# Patient Record
Sex: Female | Born: 1975 | Race: Black or African American | Hispanic: No | Marital: Single | Smoking: Never smoker
Health system: Southern US, Community
[De-identification: ages and names within clinical notes are randomized; demographics above are authoritative.]

## PROBLEM LIST (undated history)

## (undated) DIAGNOSIS — L509 Urticaria, unspecified: Secondary | ICD-10-CM

## (undated) DIAGNOSIS — Z8489 Family history of other specified conditions: Secondary | ICD-10-CM

## (undated) DIAGNOSIS — R519 Headache, unspecified: Secondary | ICD-10-CM

## (undated) DIAGNOSIS — I10 Essential (primary) hypertension: Secondary | ICD-10-CM

## (undated) DIAGNOSIS — T783XXA Angioneurotic edema, initial encounter: Secondary | ICD-10-CM

## (undated) HISTORY — DX: Urticaria, unspecified: L50.9

## (undated) HISTORY — DX: Essential (primary) hypertension: I10

## (undated) HISTORY — PX: NO PAST SURGERIES: SHX2092

## (undated) HISTORY — DX: Angioneurotic edema, initial encounter: T78.3XXA

---

## 1998-02-08 ENCOUNTER — Emergency Department (HOSPITAL_COMMUNITY): Admission: EM | Admit: 1998-02-08 | Discharge: 1998-02-08 | Payer: Self-pay | Admitting: Emergency Medicine

## 1999-05-04 ENCOUNTER — Ambulatory Visit (HOSPITAL_COMMUNITY): Admission: RE | Admit: 1999-05-04 | Discharge: 1999-05-04 | Payer: Self-pay | Admitting: *Deleted

## 1999-07-30 ENCOUNTER — Encounter: Admission: RE | Admit: 1999-07-30 | Discharge: 1999-10-28 | Payer: Self-pay | Admitting: Obstetrics

## 1999-09-19 ENCOUNTER — Inpatient Hospital Stay (HOSPITAL_COMMUNITY): Admission: AD | Admit: 1999-09-19 | Discharge: 1999-09-19 | Payer: Self-pay | Admitting: *Deleted

## 1999-09-23 ENCOUNTER — Inpatient Hospital Stay (HOSPITAL_COMMUNITY): Admission: AD | Admit: 1999-09-23 | Discharge: 1999-09-27 | Payer: Self-pay | Admitting: *Deleted

## 2000-08-14 ENCOUNTER — Emergency Department (HOSPITAL_COMMUNITY): Admission: EM | Admit: 2000-08-14 | Discharge: 2000-08-14 | Payer: Self-pay | Admitting: Emergency Medicine

## 2000-12-13 ENCOUNTER — Emergency Department (HOSPITAL_COMMUNITY): Admission: EM | Admit: 2000-12-13 | Discharge: 2000-12-13 | Payer: Self-pay | Admitting: Emergency Medicine

## 2001-12-17 ENCOUNTER — Encounter: Payer: Self-pay | Admitting: *Deleted

## 2001-12-17 ENCOUNTER — Emergency Department (HOSPITAL_COMMUNITY): Admission: EM | Admit: 2001-12-17 | Discharge: 2001-12-17 | Payer: Self-pay | Admitting: *Deleted

## 2002-07-27 ENCOUNTER — Emergency Department (HOSPITAL_COMMUNITY): Admission: EM | Admit: 2002-07-27 | Discharge: 2002-07-27 | Payer: Self-pay | Admitting: Emergency Medicine

## 2002-08-17 ENCOUNTER — Other Ambulatory Visit: Admission: RE | Admit: 2002-08-17 | Discharge: 2002-08-17 | Payer: Self-pay | Admitting: Obstetrics and Gynecology

## 2003-08-24 ENCOUNTER — Other Ambulatory Visit: Admission: RE | Admit: 2003-08-24 | Discharge: 2003-08-24 | Payer: Self-pay | Admitting: Obstetrics and Gynecology

## 2003-09-13 ENCOUNTER — Encounter: Admission: RE | Admit: 2003-09-13 | Discharge: 2003-09-13 | Payer: Self-pay | Admitting: Obstetrics and Gynecology

## 2004-02-25 ENCOUNTER — Emergency Department (HOSPITAL_COMMUNITY): Admission: EM | Admit: 2004-02-25 | Discharge: 2004-02-25 | Payer: Self-pay | Admitting: Emergency Medicine

## 2004-05-10 ENCOUNTER — Ambulatory Visit (HOSPITAL_COMMUNITY): Admission: RE | Admit: 2004-05-10 | Discharge: 2004-05-10 | Payer: Self-pay | Admitting: Obstetrics and Gynecology

## 2004-08-27 ENCOUNTER — Other Ambulatory Visit: Admission: RE | Admit: 2004-08-27 | Discharge: 2004-08-27 | Payer: Self-pay | Admitting: Obstetrics and Gynecology

## 2004-09-03 ENCOUNTER — Encounter: Admission: RE | Admit: 2004-09-03 | Discharge: 2004-12-02 | Payer: Self-pay | Admitting: Obstetrics and Gynecology

## 2005-08-26 ENCOUNTER — Other Ambulatory Visit: Admission: RE | Admit: 2005-08-26 | Discharge: 2005-08-26 | Payer: Self-pay | Admitting: Obstetrics and Gynecology

## 2007-10-04 ENCOUNTER — Emergency Department (HOSPITAL_COMMUNITY): Admission: EM | Admit: 2007-10-04 | Discharge: 2007-10-04 | Payer: Self-pay | Admitting: Family Medicine

## 2009-03-17 ENCOUNTER — Emergency Department (HOSPITAL_COMMUNITY): Admission: EM | Admit: 2009-03-17 | Discharge: 2009-03-17 | Payer: Self-pay | Admitting: Emergency Medicine

## 2010-08-08 LAB — POCT PREGNANCY, URINE: Preg Test, Ur: NEGATIVE

## 2014-09-16 ENCOUNTER — Other Ambulatory Visit (HOSPITAL_COMMUNITY)
Admission: RE | Admit: 2014-09-16 | Discharge: 2014-09-16 | Disposition: A | Discharge status: Home / Self Care | Payer: BLUE CROSS/BLUE SHIELD | Source: Ambulatory Visit | Attending: Family Medicine | Admitting: Family Medicine

## 2014-09-16 ENCOUNTER — Other Ambulatory Visit: Payer: Self-pay | Admitting: Family Medicine

## 2014-09-16 DIAGNOSIS — Z01419 Encounter for gynecological examination (general) (routine) without abnormal findings: Secondary | ICD-10-CM | POA: Insufficient documentation

## 2014-09-16 DIAGNOSIS — Z8742 Personal history of other diseases of the female genital tract: Secondary | ICD-10-CM | POA: Diagnosis present

## 2014-09-20 LAB — CYTOLOGY - PAP

## 2016-01-17 DIAGNOSIS — Z3009 Encounter for other general counseling and advice on contraception: Secondary | ICD-10-CM | POA: Diagnosis not present

## 2016-01-17 DIAGNOSIS — Z113 Encounter for screening for infections with a predominantly sexual mode of transmission: Secondary | ICD-10-CM | POA: Diagnosis not present

## 2016-01-17 DIAGNOSIS — Z01419 Encounter for gynecological examination (general) (routine) without abnormal findings: Secondary | ICD-10-CM | POA: Diagnosis not present

## 2016-01-17 DIAGNOSIS — Z1151 Encounter for screening for human papillomavirus (HPV): Secondary | ICD-10-CM | POA: Diagnosis not present

## 2016-01-17 DIAGNOSIS — Z1321 Encounter for screening for nutritional disorder: Secondary | ICD-10-CM | POA: Diagnosis not present

## 2016-02-19 DIAGNOSIS — Z0000 Encounter for general adult medical examination without abnormal findings: Secondary | ICD-10-CM | POA: Diagnosis not present

## 2016-02-19 DIAGNOSIS — Z113 Encounter for screening for infections with a predominantly sexual mode of transmission: Secondary | ICD-10-CM | POA: Diagnosis not present

## 2016-02-20 DIAGNOSIS — N951 Menopausal and female climacteric states: Secondary | ICD-10-CM | POA: Diagnosis not present

## 2016-02-20 DIAGNOSIS — R635 Abnormal weight gain: Secondary | ICD-10-CM | POA: Diagnosis not present

## 2016-02-27 DIAGNOSIS — E538 Deficiency of other specified B group vitamins: Secondary | ICD-10-CM | POA: Diagnosis not present

## 2016-02-27 DIAGNOSIS — E559 Vitamin D deficiency, unspecified: Secondary | ICD-10-CM | POA: Diagnosis not present

## 2016-02-27 DIAGNOSIS — E8881 Metabolic syndrome: Secondary | ICD-10-CM | POA: Diagnosis not present

## 2016-02-29 DIAGNOSIS — E8881 Metabolic syndrome: Secondary | ICD-10-CM | POA: Diagnosis not present

## 2016-02-29 DIAGNOSIS — E559 Vitamin D deficiency, unspecified: Secondary | ICD-10-CM | POA: Diagnosis not present

## 2016-03-07 DIAGNOSIS — E538 Deficiency of other specified B group vitamins: Secondary | ICD-10-CM | POA: Diagnosis not present

## 2016-03-07 DIAGNOSIS — E559 Vitamin D deficiency, unspecified: Secondary | ICD-10-CM | POA: Diagnosis not present

## 2016-03-07 DIAGNOSIS — E8881 Metabolic syndrome: Secondary | ICD-10-CM | POA: Diagnosis not present

## 2016-03-14 DIAGNOSIS — E538 Deficiency of other specified B group vitamins: Secondary | ICD-10-CM | POA: Diagnosis not present

## 2016-03-14 DIAGNOSIS — E8881 Metabolic syndrome: Secondary | ICD-10-CM | POA: Diagnosis not present

## 2016-03-14 DIAGNOSIS — E559 Vitamin D deficiency, unspecified: Secondary | ICD-10-CM | POA: Diagnosis not present

## 2016-03-22 DIAGNOSIS — Z6841 Body Mass Index (BMI) 40.0 and over, adult: Secondary | ICD-10-CM | POA: Diagnosis not present

## 2016-03-22 DIAGNOSIS — Z713 Dietary counseling and surveillance: Secondary | ICD-10-CM | POA: Diagnosis not present

## 2016-04-08 DIAGNOSIS — E538 Deficiency of other specified B group vitamins: Secondary | ICD-10-CM | POA: Diagnosis not present

## 2016-04-08 DIAGNOSIS — E559 Vitamin D deficiency, unspecified: Secondary | ICD-10-CM | POA: Diagnosis not present

## 2016-04-08 DIAGNOSIS — E8881 Metabolic syndrome: Secondary | ICD-10-CM | POA: Diagnosis not present

## 2016-04-23 DIAGNOSIS — E8881 Metabolic syndrome: Secondary | ICD-10-CM | POA: Diagnosis not present

## 2016-04-23 DIAGNOSIS — E538 Deficiency of other specified B group vitamins: Secondary | ICD-10-CM | POA: Diagnosis not present

## 2016-04-23 DIAGNOSIS — E559 Vitamin D deficiency, unspecified: Secondary | ICD-10-CM | POA: Diagnosis not present

## 2016-04-30 DIAGNOSIS — E8881 Metabolic syndrome: Secondary | ICD-10-CM | POA: Diagnosis not present

## 2016-04-30 DIAGNOSIS — E538 Deficiency of other specified B group vitamins: Secondary | ICD-10-CM | POA: Diagnosis not present

## 2016-04-30 DIAGNOSIS — E559 Vitamin D deficiency, unspecified: Secondary | ICD-10-CM | POA: Diagnosis not present

## 2016-05-07 DIAGNOSIS — E538 Deficiency of other specified B group vitamins: Secondary | ICD-10-CM | POA: Diagnosis not present

## 2016-05-07 DIAGNOSIS — E559 Vitamin D deficiency, unspecified: Secondary | ICD-10-CM | POA: Diagnosis not present

## 2016-05-07 DIAGNOSIS — E8881 Metabolic syndrome: Secondary | ICD-10-CM | POA: Diagnosis not present

## 2016-05-14 DIAGNOSIS — E559 Vitamin D deficiency, unspecified: Secondary | ICD-10-CM | POA: Diagnosis not present

## 2016-05-14 DIAGNOSIS — E8881 Metabolic syndrome: Secondary | ICD-10-CM | POA: Diagnosis not present

## 2016-05-14 DIAGNOSIS — E538 Deficiency of other specified B group vitamins: Secondary | ICD-10-CM | POA: Diagnosis not present

## 2016-05-21 DIAGNOSIS — E559 Vitamin D deficiency, unspecified: Secondary | ICD-10-CM | POA: Diagnosis not present

## 2016-05-21 DIAGNOSIS — E538 Deficiency of other specified B group vitamins: Secondary | ICD-10-CM | POA: Diagnosis not present

## 2016-05-21 DIAGNOSIS — E8881 Metabolic syndrome: Secondary | ICD-10-CM | POA: Diagnosis not present

## 2016-06-04 DIAGNOSIS — E559 Vitamin D deficiency, unspecified: Secondary | ICD-10-CM | POA: Diagnosis not present

## 2016-06-04 DIAGNOSIS — E8881 Metabolic syndrome: Secondary | ICD-10-CM | POA: Diagnosis not present

## 2016-06-04 DIAGNOSIS — E538 Deficiency of other specified B group vitamins: Secondary | ICD-10-CM | POA: Diagnosis not present

## 2016-06-27 DIAGNOSIS — E538 Deficiency of other specified B group vitamins: Secondary | ICD-10-CM | POA: Diagnosis not present

## 2016-06-27 DIAGNOSIS — E559 Vitamin D deficiency, unspecified: Secondary | ICD-10-CM | POA: Diagnosis not present

## 2016-06-27 DIAGNOSIS — E8881 Metabolic syndrome: Secondary | ICD-10-CM | POA: Diagnosis not present

## 2016-10-10 DIAGNOSIS — Z13220 Encounter for screening for lipoid disorders: Secondary | ICD-10-CM | POA: Diagnosis not present

## 2016-10-10 DIAGNOSIS — E669 Obesity, unspecified: Secondary | ICD-10-CM | POA: Diagnosis not present

## 2016-10-10 DIAGNOSIS — Z131 Encounter for screening for diabetes mellitus: Secondary | ICD-10-CM | POA: Diagnosis not present

## 2016-10-10 DIAGNOSIS — Z6841 Body Mass Index (BMI) 40.0 and over, adult: Secondary | ICD-10-CM | POA: Diagnosis not present

## 2016-10-10 DIAGNOSIS — I10 Essential (primary) hypertension: Secondary | ICD-10-CM | POA: Diagnosis not present

## 2016-10-15 DIAGNOSIS — Z113 Encounter for screening for infections with a predominantly sexual mode of transmission: Secondary | ICD-10-CM | POA: Diagnosis not present

## 2016-10-22 DIAGNOSIS — L438 Other lichen planus: Secondary | ICD-10-CM | POA: Diagnosis not present

## 2017-01-15 DIAGNOSIS — I10 Essential (primary) hypertension: Secondary | ICD-10-CM | POA: Diagnosis not present

## 2017-01-15 DIAGNOSIS — M545 Low back pain: Secondary | ICD-10-CM | POA: Diagnosis not present

## 2017-02-28 DIAGNOSIS — I10 Essential (primary) hypertension: Secondary | ICD-10-CM | POA: Diagnosis not present

## 2017-02-28 DIAGNOSIS — Z6841 Body Mass Index (BMI) 40.0 and over, adult: Secondary | ICD-10-CM | POA: Diagnosis not present

## 2017-04-11 DIAGNOSIS — I10 Essential (primary) hypertension: Secondary | ICD-10-CM | POA: Diagnosis not present

## 2017-04-11 DIAGNOSIS — Z6841 Body Mass Index (BMI) 40.0 and over, adult: Secondary | ICD-10-CM | POA: Diagnosis not present

## 2017-05-27 DIAGNOSIS — Z6841 Body Mass Index (BMI) 40.0 and over, adult: Secondary | ICD-10-CM | POA: Diagnosis not present

## 2017-05-27 DIAGNOSIS — I10 Essential (primary) hypertension: Secondary | ICD-10-CM | POA: Insufficient documentation

## 2017-06-27 DIAGNOSIS — I10 Essential (primary) hypertension: Secondary | ICD-10-CM | POA: Diagnosis not present

## 2017-09-11 DIAGNOSIS — Z01419 Encounter for gynecological examination (general) (routine) without abnormal findings: Secondary | ICD-10-CM | POA: Diagnosis not present

## 2017-09-17 DIAGNOSIS — Z113 Encounter for screening for infections with a predominantly sexual mode of transmission: Secondary | ICD-10-CM | POA: Diagnosis not present

## 2017-09-17 DIAGNOSIS — Z01419 Encounter for gynecological examination (general) (routine) without abnormal findings: Secondary | ICD-10-CM | POA: Diagnosis not present

## 2017-09-17 DIAGNOSIS — Z1151 Encounter for screening for human papillomavirus (HPV): Secondary | ICD-10-CM | POA: Diagnosis not present

## 2017-12-09 DIAGNOSIS — Z713 Dietary counseling and surveillance: Secondary | ICD-10-CM | POA: Diagnosis not present

## 2018-01-16 DIAGNOSIS — Z713 Dietary counseling and surveillance: Secondary | ICD-10-CM | POA: Diagnosis not present

## 2018-03-19 DIAGNOSIS — Z23 Encounter for immunization: Secondary | ICD-10-CM | POA: Diagnosis not present

## 2018-03-26 DIAGNOSIS — Z713 Dietary counseling and surveillance: Secondary | ICD-10-CM | POA: Diagnosis not present

## 2018-05-18 DIAGNOSIS — Z713 Dietary counseling and surveillance: Secondary | ICD-10-CM | POA: Diagnosis not present

## 2018-06-23 DIAGNOSIS — Z23 Encounter for immunization: Secondary | ICD-10-CM | POA: Diagnosis not present

## 2018-08-17 DIAGNOSIS — Z713 Dietary counseling and surveillance: Secondary | ICD-10-CM | POA: Diagnosis not present

## 2018-10-27 DIAGNOSIS — Z1159 Encounter for screening for other viral diseases: Secondary | ICD-10-CM | POA: Diagnosis not present

## 2018-12-30 DIAGNOSIS — Z3009 Encounter for other general counseling and advice on contraception: Secondary | ICD-10-CM | POA: Diagnosis not present

## 2018-12-30 DIAGNOSIS — I169 Hypertensive crisis, unspecified: Secondary | ICD-10-CM | POA: Diagnosis not present

## 2018-12-30 DIAGNOSIS — Z713 Dietary counseling and surveillance: Secondary | ICD-10-CM | POA: Diagnosis not present

## 2019-01-01 DIAGNOSIS — Z131 Encounter for screening for diabetes mellitus: Secondary | ICD-10-CM | POA: Diagnosis not present

## 2019-01-01 DIAGNOSIS — Z113 Encounter for screening for infections with a predominantly sexual mode of transmission: Secondary | ICD-10-CM | POA: Diagnosis not present

## 2019-01-01 DIAGNOSIS — Z13220 Encounter for screening for lipoid disorders: Secondary | ICD-10-CM | POA: Diagnosis not present

## 2019-01-01 DIAGNOSIS — Z1329 Encounter for screening for other suspected endocrine disorder: Secondary | ICD-10-CM | POA: Diagnosis not present

## 2019-05-14 DIAGNOSIS — R21 Rash and other nonspecific skin eruption: Secondary | ICD-10-CM | POA: Diagnosis not present

## 2019-05-14 DIAGNOSIS — L299 Pruritus, unspecified: Secondary | ICD-10-CM | POA: Diagnosis not present

## 2019-05-14 DIAGNOSIS — L710 Perioral dermatitis: Secondary | ICD-10-CM | POA: Diagnosis not present

## 2019-07-23 DIAGNOSIS — E559 Vitamin D deficiency, unspecified: Secondary | ICD-10-CM | POA: Diagnosis not present

## 2019-07-23 DIAGNOSIS — E7800 Pure hypercholesterolemia, unspecified: Secondary | ICD-10-CM | POA: Diagnosis not present

## 2019-07-23 DIAGNOSIS — Z03818 Encounter for observation for suspected exposure to other biological agents ruled out: Secondary | ICD-10-CM | POA: Diagnosis not present

## 2019-07-23 DIAGNOSIS — Z79899 Other long term (current) drug therapy: Secondary | ICD-10-CM | POA: Diagnosis not present

## 2019-07-23 DIAGNOSIS — Z131 Encounter for screening for diabetes mellitus: Secondary | ICD-10-CM | POA: Diagnosis not present

## 2019-07-23 DIAGNOSIS — R0602 Shortness of breath: Secondary | ICD-10-CM | POA: Diagnosis not present

## 2019-07-23 DIAGNOSIS — N183 Chronic kidney disease, stage 3 unspecified: Secondary | ICD-10-CM | POA: Diagnosis not present

## 2019-09-21 DIAGNOSIS — Z79899 Other long term (current) drug therapy: Secondary | ICD-10-CM | POA: Diagnosis not present

## 2019-09-21 DIAGNOSIS — E782 Mixed hyperlipidemia: Secondary | ICD-10-CM | POA: Diagnosis not present

## 2019-09-21 DIAGNOSIS — E538 Deficiency of other specified B group vitamins: Secondary | ICD-10-CM | POA: Diagnosis not present

## 2019-09-21 DIAGNOSIS — E559 Vitamin D deficiency, unspecified: Secondary | ICD-10-CM | POA: Diagnosis not present

## 2020-08-09 ENCOUNTER — Ambulatory Visit (INDEPENDENT_AMBULATORY_CARE_PROVIDER_SITE_OTHER): Payer: Managed Care, Other (non HMO) | Admitting: Nurse Practitioner

## 2020-08-09 ENCOUNTER — Other Ambulatory Visit: Payer: Self-pay

## 2020-08-09 VITALS — BP 109/72 | HR 99 | Temp 97.9°F | Ht 63.0 in | Wt 228.4 lb

## 2020-08-09 DIAGNOSIS — N289 Disorder of kidney and ureter, unspecified: Secondary | ICD-10-CM | POA: Diagnosis not present

## 2020-08-09 DIAGNOSIS — Z6841 Body Mass Index (BMI) 40.0 and over, adult: Secondary | ICD-10-CM | POA: Diagnosis not present

## 2020-08-09 DIAGNOSIS — I10 Essential (primary) hypertension: Secondary | ICD-10-CM | POA: Diagnosis not present

## 2020-08-09 DIAGNOSIS — Z7689 Persons encountering health services in other specified circumstances: Secondary | ICD-10-CM

## 2020-08-09 MED ORDER — HYDROCHLOROTHIAZIDE 25 MG PO TABS: 25.0000 mg | ORAL_TABLET | Freq: Every day | ORAL | 1 refills | Status: DC

## 2020-08-09 NOTE — Progress Notes (Signed)
New Patient Office Visit  Subjective:  Patient ID: Helen Clark, female    DOB: 1976-02-12  Age: 45 y.o. MRN: [id number]  CC:  Chief Complaint  Patient presents with  . New Patient (Initial Visit)    HPI Helen Clark presents to establish care with new primary care provider. She is a patient at [facility] for [employer]. She takes Saxenda injections daily. She takes 3mg  every day. She has lost more than 20 pounds since starting this program. In addition to medication, she has increased her physical activity. She eats a 1200 calorie diet. She does take Linzess on as needed basis for constipation, as this medication is known to cause constipation.  At her most recent visit, she did have blood work done. She was told that she was in "kidney failure." she will bring a copy of these lab results to her next visit. She was able to pull them up on her phone App. Renal functions were abnormal, but she would not be considered in renal failure. She does take triamterene/HCTZ 37.5/25mg  tablets every day. Since both medications are diuretic in nature, this may be causing a bit of kidney damage over time. Her blood pressure is very well managed, even on low side of normal.  Her lipid panel on this set of labs was very good. Total cholesterol 123, HDL 51, triglycerides 47, and LDL 63.  The patient denies chest pain, chest pressure, shortness of breath, or headaches. She denies dizziness, or vertigo. She denies nausea, vomiting, or changes in bowel habits.  She is due to have annual wellness visit and pap smear.   Past Medical History:  Diagnosis Date  . Hypertension    Phreesia 08/09/2020    History reviewed. No pertinent surgical history.  Family History  Problem Relation Age of Onset  . High blood pressure Mother   . Stroke Mother   . High Cholesterol Father     Social History   Socioeconomic History  . Marital status: Single    Spouse name: Not on file  . Number of children:  Not on file  . Years of education: Not on file  . Highest education level: Not on file  Occupational History  . 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 Smoking status: Never Smoker  . Smokeless tobacco: Never Used  Substance and Sexual Activity  . Alcohol use: Never  . Drug use: Never  . Sexual activity: Not Currently  Other Topics Concern  . Not on file  Social History Narrative  . Not on file   Social Determinants of Health   Financial Resource Strain: Not on file  Food Insecurity: Not on file  Transportation Needs: Not on file  Physical Activity: Not on file  Stress: Not on file  Social Connections: Not on file  Intimate Partner Violence: Not on file    ROS Review of Systems  Constitutional: Negative for chills and fever.       Has lost more than 20 pounds since starting on saxenda for weight management.   HENT: Negative for congestion, postnasal drip, rhinorrhea, sinus pressure and sinus pain.   Eyes: Negative.   Respiratory: Negative for cough, chest tightness, shortness of breath and wheezing.   Cardiovascular: Negative for chest pain and palpitations.  Gastrointestinal: Positive for constipation. Negative for diarrhea, nausea and vomiting.       Intermittent constipation.   Endocrine: Negative.   Musculoskeletal: Negative for arthralgias, back pain and myalgias.  Intermittent groin pain since increased physical activity.   Skin: Negative for rash.  Allergic/Immunologic: Negative for environmental allergies.  Neurological: Negative for dizziness, weakness and headaches.  Hematological: Negative for adenopathy.  Psychiatric/Behavioral: Negative for dysphoric mood and sleep disturbance. The patient is not nervous/anxious.   All other systems reviewed and are negative.   Objective:   Today's Vitals   08/09/20 1501  BP: 109/72  Pulse: 99  Temp: 97.9 F (36.6 C)  SpO2: 97%  Weight: 228 lb 6.4 oz (103.6 kg)  Height: 5\' 3"  (1.6 m)   Body mass index is 40.46  kg/m.   Physical Exam Vitals and nursing note reviewed.  Constitutional:      Appearance: Normal appearance. She is well-developed. She is obese.  HENT:     Head: Normocephalic.     Nose: Nose normal.  Eyes:     Extraocular Movements: Extraocular movements intact.     Conjunctiva/sclera: Conjunctivae normal.     Pupils: Pupils are equal, round, and reactive to light.  Cardiovascular:     Rate and Rhythm: Normal rate and regular rhythm.     Pulses: Normal pulses.     Heart sounds: Normal heart sounds.  Pulmonary:     Effort: Pulmonary effort is normal.     Breath sounds: Normal breath sounds.  Abdominal:     General: Bowel sounds are normal.     Palpations: Abdomen is soft.     Tenderness: There is no abdominal tenderness.  Musculoskeletal:        General: Normal range of motion.     Cervical back: Normal range of motion and neck supple.  Skin:    General: Skin is warm and dry.     Capillary Refill: Capillary refill takes less than 2 seconds.  Neurological:     General: No focal deficit present.     Mental Status: She is alert and oriented to person, place, and time.  Psychiatric:        Mood and Affect: Mood normal.        Behavior: Behavior normal.        Thought Content: Thought content normal.        Judgment: Judgment normal.     Assessment & Plan:  1. Encounter to establish care Appointment today to establish primary care provdier.   2. Abnormal renal function Able to view patient's lab results showing elevated renal functions and calculated GFR. Will d/c triamterene/HCTZ. Start HCTZ only. Will recheck BMP prior to next visit.   3. Essential hypertension D/c triamterene/HCTZ and start HCTZ only. Advised her to monitor her blood pressure closely at home. Notify the office if it starts to consistently run higher than 140s/80s.  - hydrochlorothiazide (HYDRODIURIL) 25 MG tablet; Take 1 tablet (25 mg total) by mouth daily.  Dispense: 90 tablet; Refill: 1  4. BMI  40.0-44.9, adult (HCC) Continue saxenda as prescribed.   Problem List Items Addressed This Visit      Cardiovascular and Mediastinum   Essential hypertension   Relevant Medications   hydrochlorothiazide (HYDRODIURIL) 25 MG tablet     Other   Encounter to establish care - Primary   Abnormal renal function   BMI 40.0-44.9, adult (HCC)   Relevant Medications   SAXENDA 18 MG/3ML SOPN      Outpatient Encounter Medications as of 08/09/2020  Medication Sig  . hydrochlorothiazide (HYDRODIURIL) 25 MG tablet Take 1 tablet (25 mg total) by mouth daily.  . medroxyPROGESTERone (DEPO-PROVERA) 150 MG/ML injection Inject 150  mg into the muscle every 3 (three) months.  . [DISCONTINUED] triamterene-hydrochlorothiazide (DYAZIDE) 37.5-25 MG capsule Take 1 capsule by mouth daily.  Marland Kitchen SAXENDA 18 MG/3ML SOPN Inject 3 mg into the skin daily.   No facility-administered encounter medications on file as of 08/09/2020.   Time spent with the patient was approximately 45 minutes. This time included reviewing progress notes, labs, imaging studies, and discussing plan for follow up.   Follow-up: Return in about 2 weeks (around 08/23/2020) for 2 week lab draw - BMP only - 6 week physical with pap .   Carlean Jews, NP

## 2020-08-09 NOTE — Patient Instructions (Signed)

## 2020-08-11 ENCOUNTER — Ambulatory Visit: Payer: BLUE CROSS/BLUE SHIELD | Admitting: Nurse Practitioner

## 2020-08-14 ENCOUNTER — Other Ambulatory Visit: Payer: Self-pay | Admitting: Nurse Practitioner

## 2020-08-14 DIAGNOSIS — Z0000 Encounter for general adult medical examination without abnormal findings: Secondary | ICD-10-CM

## 2020-08-14 DIAGNOSIS — I10 Essential (primary) hypertension: Secondary | ICD-10-CM

## 2020-08-15 DIAGNOSIS — I10 Essential (primary) hypertension: Secondary | ICD-10-CM | POA: Insufficient documentation

## 2020-08-15 DIAGNOSIS — Z7689 Persons encountering health services in other specified circumstances: Secondary | ICD-10-CM | POA: Insufficient documentation

## 2020-08-15 DIAGNOSIS — Z6841 Body Mass Index (BMI) 40.0 and over, adult: Secondary | ICD-10-CM | POA: Insufficient documentation

## 2020-08-15 DIAGNOSIS — N289 Disorder of kidney and ureter, unspecified: Secondary | ICD-10-CM | POA: Insufficient documentation

## 2020-08-23 ENCOUNTER — Other Ambulatory Visit: Payer: Self-pay

## 2020-08-23 ENCOUNTER — Other Ambulatory Visit: Payer: Managed Care, Other (non HMO)

## 2020-08-23 DIAGNOSIS — Z0000 Encounter for general adult medical examination without abnormal findings: Secondary | ICD-10-CM

## 2020-08-23 DIAGNOSIS — I10 Essential (primary) hypertension: Secondary | ICD-10-CM

## 2020-08-24 LAB — BASIC METABOLIC PANEL
BUN/Creatinine Ratio: 11 (ref 9–23)
BUN: 14 mg/dL (ref 6–24)
CO2: 21 mmol/L (ref 20–29)
Calcium: 9.6 mg/dL (ref 8.7–10.2)
Chloride: 104 mmol/L (ref 96–106)
Creatinine, Ser: 1.22 mg/dL — ABNORMAL HIGH (ref 0.57–1.00)
Glucose: 85 mg/dL (ref 65–99)
Potassium: 3.6 mmol/L (ref 3.5–5.2)
Sodium: 144 mmol/L (ref 134–144)
eGFR: 57 mL/min/{1.73_m2} — ABNORMAL LOW (ref >59)

## 2020-08-24 NOTE — Progress Notes (Signed)
Hi. Can you let the patient know that I believe her renal functions are improved. She should schedule appointment for her physical and pap and see how blood pressure is doing. Thanks.

## 2020-09-11 ENCOUNTER — Encounter: Payer: Self-pay | Admitting: Nurse Practitioner

## 2020-09-11 ENCOUNTER — Other Ambulatory Visit: Payer: Self-pay

## 2020-09-11 ENCOUNTER — Ambulatory Visit (INDEPENDENT_AMBULATORY_CARE_PROVIDER_SITE_OTHER): Payer: Managed Care, Other (non HMO) | Admitting: Nurse Practitioner

## 2020-09-11 VITALS — BP 136/91 | HR 86 | Temp 98.5°F | Ht 63.0 in | Wt 226.7 lb

## 2020-09-11 DIAGNOSIS — F0781 Postconcussional syndrome: Secondary | ICD-10-CM | POA: Diagnosis not present

## 2020-09-11 DIAGNOSIS — I10 Essential (primary) hypertension: Secondary | ICD-10-CM

## 2020-09-11 DIAGNOSIS — F4310 Post-traumatic stress disorder, unspecified: Secondary | ICD-10-CM

## 2020-09-11 DIAGNOSIS — S0990XD Unspecified injury of head, subsequent encounter: Secondary | ICD-10-CM
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
## 2020-09-11 NOTE — Patient Instructions (Signed)
Post-Concussion Syndrome  A concussion is a brain injury. Post-concussion syndrome is when symptoms last longer than normal after a head injury. What are the causes? The cause of this condition is not known. It can happen if your head injury was mild or very bad. What increases the risk?  Being female.  Being young.  Having had a head injury before.  Being sad (depressed) or feeling worried or nervous (having anxiety).  Fainting when you got your concussion or not being able to remember it.  Having many symptoms or very bad symptoms at the time of your injury. What are the signs or symptoms? After a head injury, you may have physical symptoms, such as:  Having headaches.  Feeling tired.  Feeling dizzy.  Feeling weak.  Having trouble seeing.  Having trouble in bright lights.  Having trouble hearing.  Having problems balancing. You may also have mental and emotional symptoms, such as:  Not being able to remember things.  Not being able to focus.  Having trouble sleeping.  Feeling grouchy (irritable).  Feeling worried.  Feeling sad.  Having trouble learning new things. These can last from weeks to months. How is this treated? Treatment for this condition may depend on your symptoms. Symptoms usually go away on their own over time. If you need treatment, it may include:  Taking medicines.  Resting your brain and body for a few days.  Doing therapy to help you recover (rehabilitation therapy). This may include: ? Physical or occupational therapy. This may include exercises. ? Talking to a counselor. ? Speech therapy. ? Therapy to help your eyes. Follow these instructions at home: Medicines  Take all medicines only as told by your doctor.  Avoid pain medicines that have opioids in them. Ask your doctor which pain medicine to take. Activity  Limit activities as told by your doctor. This may include not doing the following: ? Homework. ? Job-related  work. ? Hard thinking. ? Watching TV. ? Using a computer or phone. ? Puzzles. ? Exercise. ? Sports.  Slowly return to your normal activity as told by your doctor.  Stop an activity if you have symptoms.  Rest. Try to: ? Sleep 7-9 hours each night. ? Take naps or breaks when you feel tired during the day.  Do not do anything that may cause you to get hurt again right away. General instructions  Do not drink alcohol until your doctor says that you can.  Keep track of your symptoms.  Keep all follow-up visits as told by your doctor. This is important.   Contact a doctor if:  You do not improve.  You get worse.  You have another injury. Get help right away if:  You have a very bad headache or a headache that gets worse.  You feel confused.  You feel very sleepy.  You faint.  You vomit.  You feel weak in any part of your body.  You feel numb in any part of your body.  You start shaking (have a seizure).  You have trouble talking. Summary  This condition is when symptoms last longer than normal after a head injury.  Limit your activities after your injury. Slowly return to normal activities as told by your doctor.  Rest.  Do not drink alcohol and avoid pain medicines that have opioids in them.  Call your doctor if your symptoms get worse. This information is not intended to replace advice given to you by your health care provider. Make sure you  discuss any questions you have with your health care provider. Document Revised: 04/14/2019 Document Reviewed: 04/14/2019 Elsevier Patient Education  2021 [employer].

## 2020-09-25 DIAGNOSIS — F0781 Postconcussional syndrome: Secondary | ICD-10-CM | POA: Insufficient documentation

## 2020-09-25 DIAGNOSIS — F4310 Post-traumatic stress disorder, unspecified: Secondary | ICD-10-CM | POA: Insufficient documentation

## 2020-09-25 DIAGNOSIS — S0990XA Unspecified injury of head, initial encounter: Secondary | ICD-10-CM | POA: Insufficient documentation

## 2020-10-03 ENCOUNTER — Ambulatory Visit (INDEPENDENT_AMBULATORY_CARE_PROVIDER_SITE_OTHER): Payer: Managed Care, Other (non HMO) | Admitting: Nurse Practitioner

## 2020-10-03 ENCOUNTER — Other Ambulatory Visit: Payer: Self-pay

## 2020-10-03 ENCOUNTER — Other Ambulatory Visit (HOSPITAL_COMMUNITY)
Admission: RE | Admit: 2020-10-03 | Discharge: 2020-10-03 | Disposition: A | Discharge status: Home / Self Care | Payer: Managed Care, Other (non HMO) | Source: Ambulatory Visit | Attending: Nurse Practitioner | Admitting: Nurse Practitioner

## 2020-10-03 ENCOUNTER — Encounter: Payer: Self-pay | Admitting: Nurse Practitioner

## 2020-10-03 VITALS — BP 131/84 | HR 80 | Temp 98.9°F | Ht 63.0 in | Wt 227.4 lb

## 2020-10-03 DIAGNOSIS — F0781 Postconcussional syndrome: Secondary | ICD-10-CM

## 2020-10-03 DIAGNOSIS — Z6841 Body Mass Index (BMI) 40.0 and over, adult: Secondary | ICD-10-CM | POA: Diagnosis not present

## 2020-10-03 DIAGNOSIS — I10 Essential (primary) hypertension: Secondary | ICD-10-CM | POA: Diagnosis not present

## 2020-10-03 DIAGNOSIS — Z0000 Encounter for general adult medical examination without abnormal findings: Secondary | ICD-10-CM | POA: Insufficient documentation

## 2020-10-03 DIAGNOSIS — N289 Disorder of kidney and ureter, unspecified: Secondary | ICD-10-CM | POA: Diagnosis not present

## 2020-10-03 DIAGNOSIS — Z3042 Encounter for surveillance of injectable contraceptive: Secondary | ICD-10-CM

## 2020-10-03 DIAGNOSIS — Z0001 Encounter for general adult medical examination with abnormal findings: Secondary | ICD-10-CM

## 2020-10-03 DIAGNOSIS — B9689 Other specified bacterial agents as the cause of diseases classified elsewhere: Secondary | ICD-10-CM

## 2020-10-03 DIAGNOSIS — F4310 Post-traumatic stress disorder, unspecified: Secondary | ICD-10-CM

## 2020-10-03 DIAGNOSIS — Z01419 Encounter for gynecological examination (general) (routine) without abnormal findings: Secondary | ICD-10-CM | POA: Insufficient documentation

## 2020-10-03 DIAGNOSIS — N760 Acute vaginitis: Secondary | ICD-10-CM | POA: Insufficient documentation

## 2020-10-03 MED ORDER — SAXENDA 18 MG/3ML ~~LOC~~ SOPN: 3.0000 mg | PEN_INJECTOR | Freq: Every day | SUBCUTANEOUS | 2 refills | Status: DC

## 2020-10-03 MED ORDER — MEDROXYPROGESTERONE ACETATE 150 MG/ML IM SUSP: 150.0000 mg | INTRAMUSCULAR | 3 refills | Status: DC

## 2020-10-03 NOTE — Patient Instructions (Signed)
Calorie Counting for Weight Loss Calories are units of energy. Your body needs a certain number of calories from food to keep going throughout the day. When you eat or drink more calories than your body needs, your body stores the extra calories mostly as fat. When you eat or drink fewer calories than your body needs, your body burns fat to get the energy it needs. Calorie counting means keeping track of how many calories you eat and drink each day. Calorie counting can be helpful if you need to lose weight. If you eat fewer calories than your body needs, you should lose weight. Ask your health care provider what a healthy weight is for you. For calorie counting to work, you will need to eat the right number of calories each day to lose a healthy amount of weight per week. A dietitian can help you figure out how many calories you need in a day and will suggest ways to reach your calorie goal.  A healthy amount of weight to lose each week is usually 1-2 lb (0.5-0.9 kg). This usually means that your daily calorie intake should be reduced by 500-750 calories.  Eating 1,200-1,500 calories a day can help most women lose weight.  Eating 1,500-1,800 calories a day can help most men lose weight. What do I need to know about calorie counting? Work with your health care provider or dietitian to determine how many calories you should get each day. To meet your daily calorie goal, you will need to:  Find out how many calories are in each food that you would like to eat. Try to do this before you eat.  Decide how much of the food you plan to eat.  Keep a food log. Do this by writing down what you ate and how many calories it had. To successfully lose weight, it is important to balance calorie counting with a healthy lifestyle that includes regular activity. Where do I find calorie information? The number of calories in a food can be found on a Nutrition Facts label. If a food does not have a Nutrition Facts  label, try to look up the calories online or ask your dietitian for help. Remember that calories are listed per serving. If you choose to have more than one serving of a food, you will have to multiply the calories per serving by the number of servings you plan to eat. For example, the label on a package of bread might say that a serving size is 1 slice and that there are 90 calories in a serving. If you eat 1 slice, you will have eaten 90 calories. If you eat 2 slices, you will have eaten 180 calories.   How do I keep a food log? After each time that you eat, record the following in your food log as soon as possible:  What you ate. Be sure to include toppings, sauces, and other extras on the food.  How much you ate. This can be measured in cups, ounces, or number of items.  How many calories were in each food and drink.  The total number of calories in the food you ate. Keep your food log near you, such as in a pocket-sized notebook or on an app or website on your mobile phone. Some programs will calculate calories for you and show you how many calories you have left to meet your daily goal. What are some portion-control tips?  Know how many calories are in a serving. This will   help you know how many servings you can have of a certain food.  Use a measuring cup to measure serving sizes. You could also try weighing out portions on a kitchen scale. With time, you will be able to estimate serving sizes for some foods.  Take time to put servings of different foods on your favorite plates or in your favorite bowls and cups so you know what a serving looks like.  Try not to eat straight from a food's packaging, such as from a bag or box. Eating straight from the package makes it hard to see how much you are eating and can lead to overeating. Put the amount you would like to eat in a cup or on a plate to make sure you are eating the right portion.  Use smaller plates, glasses, and bowls for smaller  portions and to prevent overeating.  Try not to multitask. For example, avoid watching TV or using your computer while eating. If it is time to eat, sit down at a table and enjoy your food. This will help you recognize when you are full. It will also help you be more mindful of what and how much you are eating. What are tips for following this plan? Reading food labels  Check the calorie count compared with the serving size. The serving size may be smaller than what you are used to eating.  Check the source of the calories. Try to choose foods that are high in protein, fiber, and vitamins, and low in saturated fat, trans fat, and sodium. Shopping  Read nutrition labels while you shop. This will help you make healthy decisions about which foods to buy.  Pay attention to nutrition labels for low-fat or fat-free foods. These foods sometimes have the same number of calories or more calories than the full-fat versions. They also often have added sugar, starch, or salt to make up for flavor that was removed with the fat.  Make a grocery list of lower-calorie foods and stick to it. Cooking  Try to cook your favorite foods in a healthier way. For example, try baking instead of frying.  Use low-fat dairy products. Meal planning  Use more fruits and vegetables. One-half of your plate should be fruits and vegetables.  Include lean proteins, such as chicken, turkey, and fish. Lifestyle Each week, aim to do one of the following:  150 minutes of moderate exercise, such as walking.  75 minutes of vigorous exercise, such as running. General information  Know how many calories are in the foods you eat most often. This will help you calculate calorie counts faster.  Find a way of tracking calories that works for you. Get creative. Try different apps or programs if writing down calories does not work for you. What foods should I eat?  Eat nutritious foods. It is better to have a nutritious,  high-calorie food, such as an avocado, than a food with few nutrients, such as a bag of potato chips.  Use your calories on foods and drinks that will fill you up and will not leave you hungry soon after eating. ? Examples of foods that fill you up are nuts and nut butters, vegetables, lean proteins, and high-fiber foods such as whole grains. High-fiber foods are foods with more than 5 g of fiber per serving.  Pay attention to calories in drinks. Low-calorie drinks include water and unsweetened drinks. The items listed above may not be a complete list of foods and beverages you can eat.   Contact a dietitian for more information.   What foods should I limit? Limit foods or drinks that are not good sources of vitamins, minerals, or protein or that are high in unhealthy fats. These include:  Candy.  Other sweets.  Sodas, specialty coffee drinks, alcohol, and juice. The items listed above may not be a complete list of foods and beverages you should avoid. Contact a dietitian for more information. How do I count calories when eating out?  Pay attention to portions. Often, portions are much larger when eating out. Try these tips to keep portions smaller: ? Consider sharing a meal instead of getting your own. ? If you get your own meal, eat only half of it. Before you start eating, ask for a container and put half of your meal into it. ? When available, consider ordering smaller portions from the menu instead of full portions.  Pay attention to your food and drink choices. Knowing the way food is cooked and what is included with the meal can help you eat fewer calories. ? If calories are listed on the menu, choose the lower-calorie options. ? Choose dishes that include vegetables, fruits, whole grains, low-fat dairy products, and lean proteins. ? Choose items that are boiled, broiled, grilled, or steamed. Avoid items that are buttered, battered, fried, or served with cream sauce. Items labeled as  crispy are usually fried, unless stated otherwise. ? Choose water, low-fat milk, unsweetened iced tea, or other drinks without added sugar. If you want an alcoholic beverage, choose a lower-calorie option, such as a glass of wine or light beer. ? Ask for dressings, sauces, and syrups on the side. These are usually high in calories, so you should limit the amount you eat. ? If you want a salad, choose a garden salad and ask for grilled meats. Avoid extra toppings such as bacon, cheese, or fried items. Ask for the dressing on the side, or ask for olive oil and vinegar or lemon to use as dressing.  Estimate how many servings of a food you are given. Knowing serving sizes will help you be aware of how much food you are eating at restaurants. Where to find more information  Centers for Disease Control and Prevention: www.cdc.gov  [place] Department of Agriculture: myplate.gov Summary  Calorie counting means keeping track of how many calories you eat and drink each day. If you eat fewer calories than your body needs, you should lose weight.  A healthy amount of weight to lose per week is usually 1-2 lb (0.5-0.9 kg). This usually means reducing your daily calorie intake by 500-750 calories.  The number of calories in a food can be found on a Nutrition Facts label. If a food does not have a Nutrition Facts label, try to look up the calories online or ask your dietitian for help.  Use smaller plates, glasses, and bowls for smaller portions and to prevent overeating.  Use your calories on foods and drinks that will fill you up and not leave you hungry shortly after a meal. This information is not intended to replace advice given to you by your health care provider. Make sure you discuss any questions you have with your health care provider. Document Revised: 06/03/2019 Document Reviewed: 06/03/2019 Elsevier Patient Education  2021 Elsevier Inc.  
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
## 2020-10-05 LAB — CYTOLOGY - PAP
Adequacy: ABSENT
Comment: NEGATIVE
Diagnosis: NEGATIVE
High risk HPV: NEGATIVE

## 2020-10-05 NOTE — Progress Notes (Signed)
Waiting on all results for BV. Pap normal.

## 2020-10-07 LAB — CT, NG, M GENITALIUM NAA, SWAB
Chlamydia trachomatis, NAA: NEGATIVE
Mycoplasma genitalium NAA: NEGATIVE
Neisseria gonorrhoeae, NAA: NEGATIVE

## 2020-10-09 ENCOUNTER — Encounter: Payer: Self-pay | Admitting: Nurse Practitioner

## 2020-10-09 NOTE — Progress Notes (Signed)
Pap normal. Negative for BV or other sexually transmitted disease. Message sent to patient via MyChart.

## 2020-11-21 ENCOUNTER — Encounter: Payer: Self-pay | Admitting: Nurse Practitioner

## 2020-11-21 ENCOUNTER — Other Ambulatory Visit: Payer: Self-pay

## 2020-11-21 ENCOUNTER — Ambulatory Visit (INDEPENDENT_AMBULATORY_CARE_PROVIDER_SITE_OTHER): Payer: Managed Care, Other (non HMO) | Admitting: Nurse Practitioner

## 2020-11-21 VITALS — BP 136/89 | HR 84 | Temp 97.4°F | Ht 63.0 in | Wt 213.7 lb

## 2020-11-21 DIAGNOSIS — F4310 Post-traumatic stress disorder, unspecified: Secondary | ICD-10-CM

## 2020-11-21 DIAGNOSIS — I10 Essential (primary) hypertension: Secondary | ICD-10-CM | POA: Diagnosis not present

## 2020-11-21 DIAGNOSIS — F0781 Postconcussional syndrome: Secondary | ICD-10-CM
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
## 2020-12-11 ENCOUNTER — Other Ambulatory Visit: Payer: Self-pay

## 2020-12-11 ENCOUNTER — Encounter: Payer: Self-pay | Admitting: Nurse Practitioner

## 2020-12-11 ENCOUNTER — Ambulatory Visit (INDEPENDENT_AMBULATORY_CARE_PROVIDER_SITE_OTHER): Payer: Managed Care, Other (non HMO) | Admitting: Nurse Practitioner

## 2020-12-11 VITALS — BP 138/97 | HR 83 | Temp 98.0°F | Ht 63.0 in | Wt 215.4 lb

## 2020-12-11 DIAGNOSIS — R413 Other amnesia: Secondary | ICD-10-CM | POA: Diagnosis not present

## 2020-12-11 DIAGNOSIS — K581 Irritable bowel syndrome with constipation: Secondary | ICD-10-CM

## 2020-12-11 DIAGNOSIS — F0781 Postconcussional syndrome: Secondary | ICD-10-CM

## 2020-12-11 DIAGNOSIS — F4310 Post-traumatic stress disorder, unspecified: Secondary | ICD-10-CM | POA: Diagnosis not present

## 2020-12-11 DIAGNOSIS — I10 Essential (primary) hypertension: Secondary | ICD-10-CM

## 2020-12-11 DIAGNOSIS — Z6838 Body mass index (BMI) 38.0-38.9, adult: Secondary | ICD-10-CM

## 2020-12-11 DIAGNOSIS — N289 Disorder of kidney and ureter, unspecified: Secondary | ICD-10-CM

## 2020-12-11 DIAGNOSIS — S0990XD Unspecified injury of head, subsequent encounter: Secondary | ICD-10-CM

## 2020-12-11 MED ORDER — LINACLOTIDE 290 MCG PO CAPS: 290.0000 ug | ORAL_CAPSULE | Freq: Every day | ORAL | 3 refills | Status: DC
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
## 2020-12-12 ENCOUNTER — Encounter: Payer: Self-pay | Admitting: Psychology

## 2020-12-12 LAB — BASIC METABOLIC PANEL
BUN/Creatinine Ratio: 11 (ref 9–23)
BUN: 11 mg/dL (ref 6–24)
CO2: 20 mmol/L (ref 20–29)
Calcium: 9.1 mg/dL (ref 8.7–10.2)
Chloride: 109 mmol/L — ABNORMAL HIGH (ref 96–106)
Creatinine, Ser: 1.03 mg/dL — ABNORMAL HIGH (ref 0.57–1.00)
Glucose: 95 mg/dL (ref 65–99)
Potassium: 3.9 mmol/L (ref 3.5–5.2)
Sodium: 146 mmol/L — ABNORMAL HIGH (ref 134–144)
eGFR: 70 mL/min/{1.73_m2} (ref >59)

## 2020-12-12 NOTE — Progress Notes (Signed)
Please let the patient know that her kidney functions are much better. Not quite normal, but nearly back to normal. We will recheck again with her next visit. Thanks.

## 2020-12-18 ENCOUNTER — Telehealth: Payer: Self-pay | Admitting: Nurse Practitioner

## 2020-12-18 NOTE — Telephone Encounter (Signed)
Contacted patient insurance and was advised no PA needed for CPT code 32549. Calling [facility] to notify and have them contact patient to schedule. Spoke with Danisha at GI and she is reaching out to patient. AS, CMA

## 2020-12-19 ENCOUNTER — Encounter: Payer: Self-pay | Admitting: Nurse Practitioner

## 2020-12-26 ENCOUNTER — Encounter: Payer: Self-pay | Admitting: Nurse Practitioner

## 2020-12-28 ENCOUNTER — Encounter: Payer: Self-pay | Admitting: Nurse Practitioner

## 2020-12-29 ENCOUNTER — Other Ambulatory Visit: Payer: Self-pay

## 2020-12-29 ENCOUNTER — Encounter (HOSPITAL_COMMUNITY): Payer: Self-pay | Admitting: *Deleted

## 2020-12-29 NOTE — Progress Notes (Signed)
Spoke with pt for pre-op call. Pt denies cardiac history or Diabetes. Pt is treated for HTN.   Pt's surgery is scheduled as ambulatory so no Covid test is required prior to surgery.  

## 2021-01-01 NOTE — Anesthesia Preprocedure Evaluation (Addendum)
Anesthesia Evaluation  Patient identified by MRN, date of birth, ID band Patient awake    Reviewed: Allergy & Precautions, NPO status , Patient's Chart, lab work & pertinent test results  Airway Mallampati: II  TM Distance: >3 FB Neck ROM: Full    Dental no notable dental hx. (+) Teeth Intact, Dental Advisory Given   Pulmonary    Pulmonary exam normal breath sounds clear to auscultation       Cardiovascular hypertension, Pt. on medications Normal cardiovascular exam Rhythm:Regular Rate:Normal     Neuro/Psych  Headaches, Anxiety    GI/Hepatic Neg liver ROS,   Endo/Other  negative endocrine ROS  Renal/GU negative Renal ROS     Musculoskeletal negative musculoskeletal ROS (+)   Abdominal (+) + obese,   Peds  Hematology   Anesthesia Other Findings   Reproductive/Obstetrics                            Anesthesia Physical Anesthesia Plan  ASA: 2  Anesthesia Plan: MAC   Post-op Pain Management:    Induction:   PONV Risk Score and Plan: 3 and Treatment may vary due to age or medical condition and Ondansetron  Airway Management Planned: Natural Airway and Nasal Cannula  Additional Equipment: None  Intra-op Plan:   Post-operative Plan:   Informed Consent: I have reviewed the patients History and Physical, chart, labs and discussed the procedure including the risks, benefits and alternatives for the proposed anesthesia with the patient or authorized representative who has indicated his/her understanding and acceptance.     Dental advisory given  Plan Discussed with:   Anesthesia Plan Comments:        Anesthesia Quick Evaluation

## 2021-01-02 ENCOUNTER — Other Ambulatory Visit: Payer: Self-pay | Admitting: Nurse Practitioner

## 2021-01-02 ENCOUNTER — Ambulatory Visit (HOSPITAL_COMMUNITY): Payer: Managed Care, Other (non HMO) | Admitting: Anesthesiology

## 2021-01-02 ENCOUNTER — Encounter (HOSPITAL_COMMUNITY): Payer: Self-pay

## 2021-01-02 ENCOUNTER — Ambulatory Visit (HOSPITAL_COMMUNITY)
Admission: RE | Admit: 2021-01-02 | Discharge: 2021-01-02 | Disposition: A | Discharge status: Home / Self Care | Payer: Managed Care, Other (non HMO) | Attending: Nurse Practitioner | Admitting: Nurse Practitioner

## 2021-01-02 ENCOUNTER — Encounter: Payer: Self-pay | Admitting: Nurse Practitioner

## 2021-01-02 ENCOUNTER — Encounter (HOSPITAL_COMMUNITY): Admission: RE | Disposition: A | Discharge status: Home / Self Care | Payer: Self-pay | Source: Home / Self Care

## 2021-01-02 ENCOUNTER — Ambulatory Visit (HOSPITAL_COMMUNITY)
Admission: RE | Admit: 2021-01-02 | Discharge: 2021-01-02 | Disposition: A | Discharge status: Home / Self Care | Payer: Managed Care, Other (non HMO) | Source: Ambulatory Visit | Attending: Nurse Practitioner | Admitting: Nurse Practitioner

## 2021-01-02 ENCOUNTER — Other Ambulatory Visit: Payer: Self-pay

## 2021-01-02 DIAGNOSIS — F0781 Postconcussional syndrome: Secondary | ICD-10-CM | POA: Insufficient documentation

## 2021-01-02 DIAGNOSIS — S0990XD Unspecified injury of head, subsequent encounter: Secondary | ICD-10-CM

## 2021-01-02 DIAGNOSIS — R413 Other amnesia: Secondary | ICD-10-CM | POA: Insufficient documentation

## 2021-01-02 DIAGNOSIS — I10 Essential (primary) hypertension: Secondary | ICD-10-CM

## 2021-01-02 HISTORY — PX: RADIOLOGY WITH ANESTHESIA: SHX6223

## 2021-01-02 HISTORY — DX: Headache, unspecified: R51.9

## 2021-01-02 HISTORY — DX: Family history of other specified conditions: Z84.89

## 2021-01-02 LAB — POCT PREGNANCY, URINE: Preg Test, Ur: NEGATIVE

## 2021-01-02 SURGERY — MRI WITH ANESTHESIA
Anesthesia: Monitor Anesthesia Care

## 2021-01-02 MED ORDER — LACTATED RINGERS IV SOLN: INTRAVENOUS | Status: DC

## 2021-01-02 MED ORDER — DEXMEDETOMIDINE (PRECEDEX) IN NS 20 MCG/5ML (4 MCG/ML) IV SYRINGE
PREFILLED_SYRINGE | INTRAVENOUS | Status: DC | PRN
  Administered 2021-01-02 (×7): 4 ug via INTRAVENOUS

## 2021-01-02 MED ORDER — ORAL CARE MOUTH RINSE: 15.0000 mL | Freq: Once | OROMUCOSAL | Status: AC

## 2021-01-02 MED ORDER — CHLORHEXIDINE GLUCONATE 0.12 % MT SOLN
OROMUCOSAL | Status: AC
  Administered 2021-01-02: 15 mL via OROMUCOSAL
  Filled 2021-01-02: qty 15

## 2021-01-02 MED ORDER — CHLORHEXIDINE GLUCONATE 0.12 % MT SOLN: 15.0000 mL | Freq: Once | OROMUCOSAL | Status: AC

## 2021-01-02 MED ORDER — MIDAZOLAM HCL 5 MG/5ML IJ SOLN
INTRAMUSCULAR | Status: DC | PRN
  Administered 2021-01-02: 2 mg via INTRAVENOUS

## 2021-01-02 MED ORDER — FENTANYL CITRATE (PF) 100 MCG/2ML IJ SOLN
INTRAMUSCULAR | Status: DC | PRN
  Administered 2021-01-02 (×2): 25 ug via INTRAVENOUS

## 2021-01-02 NOTE — Transfer of Care (Signed)
Immediate Anesthesia Transfer of Care Note  Patient: Helen Clark  Procedure(s) Performed: MRI WITH ANESTHESIA BRAIN WITHOUT CONTRAST  Patient Location: PACU  Anesthesia Type:MAC  Level of Consciousness: awake, alert  and oriented  Airway & Oxygen Therapy: Patient Spontanous Breathing  Post-op Assessment: Report given to RN and Post -op Vital signs reviewed and stable  Post vital signs: Reviewed and stable  Last Vitals:  Vitals Value Taken Time  BP 137/90 01/02/21 0918  Temp 36.3 C 01/02/21 0915  Pulse 76 01/02/21 0919  Resp 21 01/02/21 0919  SpO2 100 % 01/02/21 0919  Vitals shown include unvalidated device data.  Last Pain:  Vitals:   01/02/21 0915  TempSrc:   PainSc: 0-No pain         Complications: No notable events documented.

## 2021-01-02 NOTE — Progress Notes (Signed)
Chronic ethmoid sinusitis. Otherwise normal brain MRI.

## 2021-01-02 NOTE — Anesthesia Postprocedure Evaluation (Signed)
Anesthesia Post Note  Patient: Helen Clark  Procedure(s) Performed: MRI WITH ANESTHESIA BRAIN WITHOUT CONTRAST     Patient location during evaluation: PACU Anesthesia Type: MAC Level of consciousness: awake and alert Pain management: pain level controlled Vital Signs Assessment: post-procedure vital signs reviewed and stable Respiratory status: spontaneous breathing, nonlabored ventilation, respiratory function stable and patient connected to nasal cannula oxygen Cardiovascular status: stable and blood pressure returned to baseline Postop Assessment: no apparent nausea or vomiting Anesthetic complications: no   No notable events documented.  Last Vitals:  Vitals:   01/02/21 0900 01/02/21 0915  BP: (!) 130/91 137/90  Pulse: 79 73  Resp: 15 19  Temp: 36.5 C (!) 36.3 C  SpO2: 99% 100%    Last Pain:  Vitals:   01/02/21 0915  TempSrc:   PainSc: 0-No pain                 Barnet Glasgow

## 2021-01-03 ENCOUNTER — Encounter (HOSPITAL_COMMUNITY): Payer: Self-pay | Admitting: Radiology

## 2021-01-16 ENCOUNTER — Encounter: Payer: Self-pay | Admitting: Nurse Practitioner

## 2021-01-18 ENCOUNTER — Encounter: Payer: Self-pay | Admitting: Psychology

## 2021-01-24 ENCOUNTER — Other Ambulatory Visit: Payer: Self-pay | Admitting: Nurse Practitioner

## 2021-01-24 DIAGNOSIS — Z6841 Body Mass Index (BMI) 40.0 and over, adult: Secondary | ICD-10-CM

## 2021-01-27 ENCOUNTER — Other Ambulatory Visit: Payer: Self-pay | Admitting: Nurse Practitioner

## 2021-01-27 DIAGNOSIS — K581 Irritable bowel syndrome with constipation: Secondary | ICD-10-CM

## 2021-06-27 ENCOUNTER — Ambulatory Visit (INDEPENDENT_AMBULATORY_CARE_PROVIDER_SITE_OTHER): Payer: 59 | Admitting: Nurse Practitioner

## 2021-06-27 ENCOUNTER — Encounter: Payer: Self-pay | Admitting: Nurse Practitioner

## 2021-06-27 ENCOUNTER — Other Ambulatory Visit: Payer: Self-pay

## 2021-06-27 VITALS — BP 118/88 | HR 78 | Temp 98.0°F | Ht 63.0 in | Wt 226.7 lb

## 2021-06-27 DIAGNOSIS — Z3042 Encounter for surveillance of injectable contraceptive: Secondary | ICD-10-CM

## 2021-06-27 DIAGNOSIS — Z0000 Encounter for general adult medical examination without abnormal findings: Secondary | ICD-10-CM

## 2021-06-27 DIAGNOSIS — Z6841 Body Mass Index (BMI) 40.0 and over, adult: Secondary | ICD-10-CM

## 2021-06-27 DIAGNOSIS — I10 Essential (primary) hypertension: Secondary | ICD-10-CM

## 2021-06-27 DIAGNOSIS — R5383 Other fatigue: Secondary | ICD-10-CM

## 2021-06-27 MED ORDER — HYDROCHLOROTHIAZIDE 25 MG PO TABS: 25.0000 mg | ORAL_TABLET | Freq: Every day | ORAL | 1 refills | Status: DC

## 2021-06-27 MED ORDER — MEDROXYPROGESTERONE ACETATE 150 MG/ML IM SUSY: 150.0000 mg | PREFILLED_SYRINGE | INTRAMUSCULAR | 3 refills | Status: DC

## 2021-06-27 NOTE — Progress Notes (Signed)
Established patient visit ° ° °Patient: Helen Clark   DOB: 05/04/1976   46 y.o. Female  MRN: [id number] °Visit Date: 06/27/2021 ° ° °Chief Complaint  °Patient presents with  ° Weight Check  ° °Subjective  °  °HPI  °The patient is here for routine follow up. She has been without health insurance for some time. Just got some back. Is concerned about weight gain. She has had an 18 pound weight gain since her most recent visit.  °-due to have routine, fasting labs. °-would like to go back on depo provera for birth control.  °-blood pressure well managed  ° ° °Medications: °Outpatient Medications Prior to Visit  °Medication Sig  ° linaclotide (LINZESS) 290 MCG CAPS capsule Take 1 capsule (290 mcg total) by mouth daily as needed (constipation.).  ° Multiple Vitamin (MULTIVITAMIN WITH MINERALS) TABS tablet Take 1 tablet by mouth every evening.  ° [DISCONTINUED] hydrochlorothiazide (HYDRODIURIL) 25 MG tablet TAKE 1 TABLET (25 MG TOTAL) BY MOUTH DAILY.  ° [DISCONTINUED] medroxyPROGESTERone Acetate 150 MG/ML SUSY Inject 150 mg into the muscle every 3 (three) months.  ° [DISCONTINUED] SAXENDA 18 MG/3ML SOPN INJECT 3 MG INTO THE SKIN ONCE DAILY  ° acetaminophen (TYLENOL) 500 MG tablet Take 1,000 mg by mouth every 6 (six) hours as needed (for pain.). (Patient not taking: Reported on 06/27/2021)  ° °No facility-administered medications prior to visit.  ° ° °Review of Systems  °Constitutional:  Negative for activity change, appetite change, chills, fatigue and fever.  °     Weight gain of 18 pounds since her most recent visit.   °HENT:  Negative for congestion, postnasal drip, rhinorrhea, sinus pressure, sinus pain, sneezing and sore throat.   °Eyes: Negative.   °Respiratory:  Negative for cough, chest tightness, shortness of breath and wheezing.   °Cardiovascular:  Negative for chest pain and palpitations.  °Gastrointestinal:  Negative for abdominal pain, constipation, diarrhea, nausea and vomiting.  °Endocrine: Negative for cold  intolerance, heat intolerance, polydipsia and polyuria.  °Genitourinary:  Negative for dyspareunia, dysuria, flank pain, frequency and urgency.  °Musculoskeletal:  Negative for arthralgias, back pain and myalgias.  °Skin:  Negative for rash.  °Allergic/Immunologic: Negative for environmental allergies.  °Neurological:  Negative for dizziness, weakness and headaches.  °Hematological:  Negative for adenopathy.  °Psychiatric/Behavioral:  The patient is not nervous/anxious.   ° ° ° ° Objective  °  ° °Today's Vitals  ° 06/27/21 1008  °BP: 118/88  °Pulse: 78  °Temp: 98 °F (36.7 °C)  °SpO2: 96%  °Weight: 226 lb 11.2 oz (102.8 kg)  °Height: 5' 3" (1.6 m)  ° °Body mass index is 40.16 kg/m².  ° °BP Readings from Last 3 Encounters:  °06/27/21 118/88  °01/02/21 137/90  °12/11/20 (!) 138/97  °  °Wt Readings from Last 3 Encounters:  °06/27/21 226 lb 11.2 oz (102.8 kg)  °01/02/21 208 lb (94.3 kg)  °12/11/20 215 lb 6.4 oz (97.7 kg)  °  °Physical Exam °Vitals and nursing note reviewed.  °Constitutional:   °   Appearance: Normal appearance. She is well-developed. She is obese.  °HENT:  °   Head: Normocephalic and atraumatic.  °   Nose: Nose normal.  °Eyes:  °   Extraocular Movements: Extraocular movements intact.  °   Conjunctiva/sclera: Conjunctivae normal.  °   Pupils: Pupils are equal, round, and reactive to light.  °Cardiovascular:  °   Rate and Rhythm: Normal rate and regular rhythm.  °   Pulses: Normal pulses.  °     Heart sounds:  °  Gallop present.  °Pulmonary:  °   Effort: Pulmonary effort is normal.  °   Breath sounds: Normal breath sounds.  °Abdominal:  °   Palpations: Abdomen is soft.  °Musculoskeletal:     °   General: Normal range of motion.  °   Cervical back: Normal range of motion and neck supple.  °Lymphadenopathy:  °   Cervical: No cervical adenopathy.  °Skin: °   General: Skin is warm and dry.  °   Capillary Refill: Capillary refill takes less than 2 seconds.  °Neurological:  °   General: No focal deficit present.   °   Mental Status: She is alert and oriented to person, place, and time.  °Psychiatric:     °   Mood and Affect: Mood normal.     °   Behavior: Behavior normal.     °   Thought Content: Thought content normal.     °   Judgment: Judgment normal.  °  ° °Results for orders placed or performed in visit on 06/27/21  °Hemoglobin A1c  °Result Value Ref Range  ° Hgb A1c MFr Bld 5.4 4.8 - 5.6 %  ° Est. average glucose Bld gHb Est-mCnc 108 mg/dL  °Comp Met (CMET)  °Result Value Ref Range  ° Glucose 90 70 - 99 mg/dL  ° BUN 15 6 - 24 mg/dL  ° Creatinine, Ser 0.96 0.57 - 1.00 mg/dL  ° eGFR 75 >59 mL/min/1.73  ° BUN/Creatinine Ratio 16 9 - 23  ° Sodium 141 134 - 144 mmol/L  ° Potassium 4.1 3.5 - 5.2 mmol/L  ° Chloride 101 96 - 106 mmol/L  ° CO2 25 20 - 29 mmol/L  ° Calcium 9.7 8.7 - 10.2 mg/dL  ° Total Protein 7.0 6.0 - 8.5 g/dL  ° Albumin 4.3 3.8 - 4.8 g/dL  ° Globulin, Total 2.7 1.5 - 4.5 g/dL  ° Albumin/Globulin Ratio 1.6 1.2 - 2.2  ° Bilirubin Total 0.9 0.0 - 1.2 mg/dL  ° Alkaline Phosphatase 49 44 - 121 IU/L  ° AST 20 0 - 40 IU/L  ° ALT 26 0 - 32 IU/L  °CBC  °Result Value Ref Range  ° WBC 4.5 3.4 - 10.8 x10E3/uL  ° RBC 5.74 (H) 3.77 - 5.28 x10E6/uL  ° Hemoglobin 16.7 (H) 11.1 - 15.9 g/dL  ° Hematocrit 49.4 (H) 34.0 - 46.6 %  ° MCV 86 79 - 97 fL  ° MCH 29.1 26.6 - 33.0 pg  ° MCHC 33.8 31.5 - 35.7 g/dL  ° RDW 13.0 11.7 - 15.4 %  ° Platelets 319 150 - 450 x10E3/uL  °Lipid panel  °Result Value Ref Range  ° Cholesterol, Total 138 100 - 199 mg/dL  ° Triglycerides 35 0 - 149 mg/dL  ° HDL 62 >39 mg/dL  ° VLDL Cholesterol Cal 9 5 - 40 mg/dL  ° LDL Chol Calc (NIH) 67 0 - 99 mg/dL  ° Chol/HDL Ratio 2.2 0.0 - 4.4 ratio  °TSH  °Result Value Ref Range  ° TSH 1.920 0.450 - 4.500 uIU/mL  °T4, free  °Result Value Ref Range  ° Free T4 1.25 0.82 - 1.77 ng/dL  ° ° Assessment & Plan  °  °1. Essential hypertension °Renew HCTZ 25mg tablets daily.  Dash diet information provided.  °- hydrochlorothiazide (HYDRODIURIL) 25 MG tablet; Take 1 tablet  (25 mg total) by mouth daily.  Dispense: 90 tablet; Refill: 1 ° °2. Other fatigue °Check thyroid panel today.  °-   TSH; Future - T4, free; Future - TSH - T4, free  3. Body mass index (BMI) of 40.1-44.9 in adult [facility]) Discussed lowering calorie intake to 1500 calories per day and incorporating exercise into daily routine to help lose weight.   4. Surveillance of contraceptive injection Restart depoprovera injections every three months. Negative pregnancy test in office today. l - medroxyPROGESTERone Acetate 150 MG/ML SUSY; Inject 1 mL (150 mg total) into the muscle every 3 (three) months.  Dispense: 1 mL; Refill: 3  5. Healthcare maintenance Routine, fasting labs drawn during today's visit.  - Lipid panel; Future - CBC; Future - Comp Met (CMET); Future - Hemoglobin A1c; Future - Hemoglobin A1c - Comp Met (CMET) - CBC - Lipid panel    Problem List Items Addressed This Visit       Cardiovascular and Mediastinum   Essential hypertension - Primary   Relevant Medications   hydrochlorothiazide (HYDRODIURIL) 25 MG tablet     Other   Body mass index (BMI) of 40.1-44.9 in adult [facility])   Surveillance of contraceptive injection   Relevant Medications   medroxyPROGESTERone Acetate 150 MG/ML SUSY   Healthcare maintenance   Relevant Orders   Lipid panel (Completed)   CBC (Completed)   Comp Met (CMET) (Completed)   Hemoglobin A1c (Completed)   Other fatigue   Relevant Orders   TSH (Completed)   T4, free (Completed)     Return in about 4 weeks (around 07/25/2021) for routine - weight management.         Ronnell Freshwater, NP  [facility] at [facility] [phone] (phone) [phone] (fax)  [place]

## 2021-06-28 ENCOUNTER — Other Ambulatory Visit: Payer: Self-pay | Admitting: Nurse Practitioner

## 2021-06-28 ENCOUNTER — Encounter: Payer: Self-pay | Admitting: Nurse Practitioner

## 2021-06-28 DIAGNOSIS — Z6841 Body Mass Index (BMI) 40.0 and over, adult: Secondary | ICD-10-CM

## 2021-06-28 LAB — COMPREHENSIVE METABOLIC PANEL
ALT: 26 IU/L (ref 0–32)
AST: 20 IU/L (ref 0–40)
Albumin/Globulin Ratio: 1.6 (ref 1.2–2.2)
Albumin: 4.3 g/dL (ref 3.8–4.8)
Alkaline Phosphatase: 49 IU/L (ref 44–121)
BUN/Creatinine Ratio: 16 (ref 9–23)
BUN: 15 mg/dL (ref 6–24)
Bilirubin Total: 0.9 mg/dL (ref 0.0–1.2)
CO2: 25 mmol/L (ref 20–29)
Calcium: 9.7 mg/dL (ref 8.7–10.2)
Chloride: 101 mmol/L (ref 96–106)
Creatinine, Ser: 0.96 mg/dL (ref 0.57–1.00)
Globulin, Total: 2.7 g/dL (ref 1.5–4.5)
Glucose: 90 mg/dL (ref 70–99)
Potassium: 4.1 mmol/L (ref 3.5–5.2)
Sodium: 141 mmol/L (ref 134–144)
Total Protein: 7 g/dL (ref 6.0–8.5)
eGFR: 75 mL/min/{1.73_m2} (ref >59)

## 2021-06-28 LAB — CBC
Hematocrit: 49.4 % — ABNORMAL HIGH (ref 34.0–46.6)
Hemoglobin: 16.7 g/dL — ABNORMAL HIGH (ref 11.1–15.9)
MCH: 29.1 pg (ref 26.6–33.0)
MCHC: 33.8 g/dL (ref 31.5–35.7)
MCV: 86 fL (ref 79–97)
Platelets: 319 10*3/uL (ref 150–450)
RBC: 5.74 x10E6/uL — ABNORMAL HIGH (ref 3.77–5.28)
RDW: 13 % (ref 11.7–15.4)
WBC: 4.5 10*3/uL (ref 3.4–10.8)

## 2021-06-28 LAB — HEMOGLOBIN A1C
Est. average glucose Bld gHb Est-mCnc: 108 mg/dL
Hgb A1c MFr Bld: 5.4 % (ref 4.8–5.6)

## 2021-06-28 LAB — T4, FREE: Free T4: 1.25 ng/dL (ref 0.82–1.77)

## 2021-06-28 LAB — LIPID PANEL
Chol/HDL Ratio: 2.2 ratio (ref 0.0–4.4)
Cholesterol, Total: 138 mg/dL (ref 100–199)
HDL: 62 mg/dL (ref >39)
LDL Chol Calc (NIH): 67 mg/dL (ref 0–99)
Triglycerides: 35 mg/dL (ref 0–149)
VLDL Cholesterol Cal: 9 mg/dL (ref 5–40)

## 2021-06-28 LAB — TSH: TSH: 1.92 u[IU]/mL (ref 0.450–4.500)

## 2021-06-28 MED ORDER — PHENTERMINE HCL 37.5 MG PO TABS: 37.5000 mg | ORAL_TABLET | Freq: Every day | ORAL | 0 refills | Status: DC

## 2021-06-28 NOTE — Progress Notes (Signed)
Patient notified that all of her labs were good.

## 2021-06-28 NOTE — Progress Notes (Signed)
Trial of phentermine 37.5mg  tablets to help with weight management. New prescription sent to [facility] [address]

## 2021-07-01 DIAGNOSIS — R5383 Other fatigue: Secondary | ICD-10-CM | POA: Insufficient documentation

## 2021-07-20 ENCOUNTER — Ambulatory Visit: Payer: 59 | Admitting: Nurse Practitioner

## 2021-08-14 ENCOUNTER — Ambulatory Visit: Payer: 59 | Admitting: Nurse Practitioner

## 2021-08-16 ENCOUNTER — Ambulatory Visit: Payer: 59 | Admitting: Nurse Practitioner

## 2021-08-30 ENCOUNTER — Ambulatory Visit: Payer: 59 | Admitting: Nurse Practitioner

## 2021-10-03 ENCOUNTER — Encounter: Payer: Self-pay | Admitting: Nurse Practitioner

## 2021-10-04 ENCOUNTER — Telehealth: Payer: Self-pay | Admitting: Nurse Practitioner

## 2021-10-04 ENCOUNTER — Encounter: Payer: Self-pay | Admitting: Nurse Practitioner

## 2021-10-04 ENCOUNTER — Other Ambulatory Visit: Payer: Self-pay | Admitting: Nurse Practitioner

## 2021-10-04 DIAGNOSIS — Z6841 Body Mass Index (BMI) 40.0 and over, adult: Secondary | ICD-10-CM

## 2021-10-04 MED ORDER — SAXENDA 18 MG/3ML ~~LOC~~ SOPN: PEN_INJECTOR | SUBCUTANEOUS | 0 refills | Status: DC

## 2021-10-04 MED ORDER — PEN NEEDLES 30G X 5 MM MISC: 3 refills | Status: DC

## 2021-10-04 NOTE — Telephone Encounter (Signed)
Patient requesting Reginal Lutes rx to be sent to pharmacy. AS, CMA

## 2021-10-04 NOTE — Progress Notes (Signed)
Sent new prescription for saxenda injection to [facility] [address].

## 2021-10-07 NOTE — Telephone Encounter (Signed)
This has been sent to pharmacy already. They are having trouble getting it in stock.

## 2021-10-15 ENCOUNTER — Other Ambulatory Visit: Payer: Self-pay | Admitting: Nurse Practitioner

## 2021-10-15 DIAGNOSIS — I10 Essential (primary) hypertension: Secondary | ICD-10-CM

## 2021-11-21 ENCOUNTER — Ambulatory Visit (INDEPENDENT_AMBULATORY_CARE_PROVIDER_SITE_OTHER): Payer: 59 | Admitting: Nurse Practitioner

## 2021-11-21 ENCOUNTER — Encounter: Payer: Self-pay | Admitting: Nurse Practitioner

## 2021-11-21 VITALS — BP 132/88 | HR 80 | Ht 62.99 in | Wt 226.8 lb

## 2021-11-21 DIAGNOSIS — Z6841 Body Mass Index (BMI) 40.0 and over, adult: Secondary | ICD-10-CM | POA: Diagnosis not present

## 2021-11-21 DIAGNOSIS — I10 Essential (primary) hypertension: Secondary | ICD-10-CM | POA: Diagnosis not present

## 2021-11-21 DIAGNOSIS — K581 Irritable bowel syndrome with constipation: Secondary | ICD-10-CM | POA: Diagnosis not present

## 2021-11-21 MED ORDER — TRULANCE 3 MG PO TABS: 3.0000 mg | ORAL_TABLET | Freq: Every day | ORAL | 3 refills | Status: DC

## 2021-11-21 NOTE — Progress Notes (Signed)
Established patient visit   Patient: Helen Clark   DOB: 1976-01-22   46 y.o. Female  MRN: [id number] Visit Date: 11/21/2021   Chief Complaint  Patient presents with   Weight Check   Subjective    HPI  Follow up visit  -weight management  -has been unable to get started on medication for this. Changing insurance  -needs new medication for IBS constipation    Medications: Outpatient Medications Prior to Visit  Medication Sig   hydrochlorothiazide (HYDRODIURIL) 25 MG tablet TAKE 1 TABLET (25 MG TOTAL) BY MOUTH DAILY.   medroxyPROGESTERone Acetate 150 MG/ML SUSY Inject 1 mL (150 mg total) into the muscle every 3 (three) months.   Multiple Vitamin (MULTIVITAMIN WITH MINERALS) TABS tablet Take 1 tablet by mouth every evening.   [DISCONTINUED] linaclotide (LINZESS) 290 MCG CAPS capsule Take 1 capsule (290 mcg total) by mouth daily as needed (constipation.).   acetaminophen (TYLENOL) 500 MG tablet Take 1,000 mg by mouth every 6 (six) hours as needed (for pain.). (Patient not taking: Reported on 06/27/2021)   [DISCONTINUED] Insulin Pen Needle (PEN NEEDLES) 30G X 5 MM MISC To use with saxenda injections daily (Patient not taking: Reported on 11/21/2021)   [DISCONTINUED] Liraglutide -Weight Management (SAXENDA) 18 MG/3ML SOPN Inject 0.6 mg [place] once daily x 1 wk. Then increase dose by 0.6 mg/day every 7 days to target of 3 mg/day. (Patient not taking: Reported on 11/21/2021)   No facility-administered medications prior to visit.    Review of Systems  Constitutional:  Negative for activity change, appetite change, chills, fatigue and fever.  HENT:  Negative for congestion, postnasal drip, rhinorrhea, sinus pressure, sinus pain, sneezing and sore throat.   Eyes: Negative.   Respiratory:  Negative for cough, chest tightness, shortness of breath and wheezing.   Cardiovascular:  Negative for chest pain and palpitations.       Elevated blood pressure   Gastrointestinal:  Positive for constipation.  Negative for abdominal pain, diarrhea, nausea and vomiting.  Endocrine: Negative for cold intolerance, heat intolerance, polydipsia and polyuria.  Genitourinary:  Negative for dyspareunia, dysuria, flank pain, frequency and urgency.  Musculoskeletal:  Negative for arthralgias, back pain and myalgias.  Skin:  Negative for rash.  Allergic/Immunologic: Negative for environmental allergies.  Neurological:  Negative for dizziness, weakness and headaches.  Hematological:  Negative for adenopathy.  Psychiatric/Behavioral:  The patient is not nervous/anxious.        Objective     Today's Vitals   11/21/21 1552 11/21/21 1618  BP: (!) 127/91 132/88  Pulse: 80   SpO2: 94%   Weight: 226 lb 12.8 oz (102.9 kg)   Height: 5' 2.99" (1.6 m)    Body mass index is 40.19 kg/m.   BP Readings from Last 3 Encounters:  11/21/21 132/88  06/27/21 118/88  01/02/21 137/90    Wt Readings from Last 3 Encounters:  11/21/21 226 lb 12.8 oz (102.9 kg)  06/27/21 226 lb 11.2 oz (102.8 kg)  01/02/21 208 lb (94.3 kg)    Physical Exam Vitals and nursing note reviewed.  Constitutional:      Appearance: Normal appearance. She is well-developed. She is obese.  HENT:     Head: Normocephalic and atraumatic.  Eyes:     Pupils: Pupils are equal, round, and reactive to light.  Cardiovascular:     Rate and Rhythm: Normal rate and regular rhythm.     Pulses: Normal pulses.     Heart sounds: Normal heart sounds.  Pulmonary:  Effort: Pulmonary effort is normal.     Breath sounds: Normal breath sounds.  Abdominal:     General: Bowel sounds are normal. There is no distension.     Palpations: Abdomen is soft. There is no mass.     Tenderness: There is no abdominal tenderness. There is no right CVA tenderness, left CVA tenderness, guarding or rebound.     Hernia: No hernia is present.  Musculoskeletal:        General: Normal range of motion.     Cervical back: Normal range of motion and neck supple.   Lymphadenopathy:     Cervical: No cervical adenopathy.  Skin:    General: Skin is warm and dry.     Capillary Refill: Capillary refill takes less than 2 seconds.  Neurological:     General: No focal deficit present.     Mental Status: She is alert and oriented to person, place, and time.  Psychiatric:        Mood and Affect: Mood normal.        Behavior: Behavior normal.        Thought Content: Thought content normal.        Judgment: Judgment normal.       Assessment & Plan    1. Essential hypertension Stable. Continue bp medication as prescribed   2. Irritable bowel syndrome with constipation Trial trulance 3 mg daily. Ensure proper fiber and water intake. Reassess at next visitl  - Plecanatide (TRULANCE) 3 MG TABS; Take 3 mg by mouth daily.  Dispense: 30 tablet; Refill: 3  3. Body mass index (BMI) of 40.1-44.9 in adult [facility]) Discussed lowering calorie intake to 1500 calories per day and incorporating exercise into daily routine to help lose weight.    Problem List Items Addressed This Visit       Cardiovascular and Mediastinum   Essential hypertension - Primary     Digestive   Irritable bowel syndrome with constipation   Relevant Medications   Plecanatide (TRULANCE) 3 MG TABS     Other   Body mass index (BMI) of 40.1-44.9 in adult [facility])     Return in about 4 months (around 03/24/2022) for blood pressure.         Carlean Jews, NP  [facility] at [facility] [phone] (phone) [phone] (fax)  [facility]

## 2022-01-12 ENCOUNTER — Other Ambulatory Visit: Payer: Self-pay | Admitting: Nurse Practitioner

## 2022-01-12 DIAGNOSIS — I10 Essential (primary) hypertension: Secondary | ICD-10-CM

## 2022-01-13 NOTE — Progress Notes (Signed)
Established patient visit   Patient: Helen Clark   DOB: 03/01/1976   46 y.o. Female  MRN: [id number] Visit Date: 01/14/2022   Chief Complaint  Patient presents with   Weight Loss   Subjective    HPI Follow up visit  -discuss weight management  --Reginal Lutes has been prescribed - unable to get starting dosing as on backorder for several months.  -her insurance has changed and they do cover the wegovy. She states that acquaintances have been able to get starting doses of Wegovy at [facility] in [place].   -facial acne. Used clindamycin topical solution in the past which has worked well. Would like to have this back    Medications: Outpatient Medications Prior to Visit  Medication Sig   medroxyPROGESTERone Acetate 150 MG/ML SUSY Inject 1 mL (150 mg total) into the muscle every 3 (three) months.   Multiple Vitamin (MULTIVITAMIN WITH MINERALS) TABS tablet Take 1 tablet by mouth every evening.   [DISCONTINUED] hydrochlorothiazide (HYDRODIURIL) 25 MG tablet TAKE 1 TABLET (25 MG TOTAL) BY MOUTH DAILY.   [DISCONTINUED] Plecanatide (TRULANCE) 3 MG TABS Take 3 mg by mouth daily.   acetaminophen (TYLENOL) 500 MG tablet Take 1,000 mg by mouth every 6 (six) hours as needed (for pain.). (Patient not taking: Reported on 06/27/2021)   No facility-administered medications prior to visit.    Review of Systems  Constitutional:  Negative for activity change, appetite change, chills, fatigue and fever.       Four pound weight gain since last visit   HENT:  Negative for congestion, postnasal drip, rhinorrhea, sinus pressure, sinus pain, sneezing and sore throat.   Eyes: Negative.   Respiratory:  Negative for cough, chest tightness, shortness of breath and wheezing.   Cardiovascular:  Negative for chest pain and palpitations.  Gastrointestinal:  Positive for constipation. Negative for abdominal pain, diarrhea, nausea and vomiting.  Endocrine: Negative for cold intolerance, heat intolerance,  polydipsia and polyuria.  Genitourinary:  Negative for dyspareunia, dysuria, flank pain, frequency and urgency.  Musculoskeletal:  Negative for arthralgias, back pain and myalgias.  Skin:  Negative for rash.       Facial acne   Allergic/Immunologic: Negative for environmental allergies.  Neurological:  Negative for dizziness, weakness and headaches.  Hematological:  Negative for adenopathy.  Psychiatric/Behavioral:  The patient is not nervous/anxious.        Objective     Today's Vitals   01/14/22 1514 01/14/22 1538  BP: (Abnormal) 133/90 128/84  Pulse: 89   SpO2: 97%   Weight: 230 lb (104.3 kg)   Height: 5' 2.99" (1.6 m)    Body mass index is 40.76 kg/m.   BP Readings from Last 3 Encounters:  01/14/22 128/84  11/21/21 132/88  06/27/21 118/88    Wt Readings from Last 3 Encounters:  01/14/22 230 lb (104.3 kg)  11/21/21 226 lb 12.8 oz (102.9 kg)  06/27/21 226 lb 11.2 oz (102.8 kg)    Physical Exam Vitals and nursing note reviewed.  Constitutional:      Appearance: Normal appearance. She is well-developed. She is obese.  HENT:     Head: Normocephalic and atraumatic.     Nose: Nose normal.     Mouth/Throat:     Mouth: Mucous membranes are moist.     Pharynx: Oropharynx is clear.  Eyes:     Extraocular Movements: Extraocular movements intact.     Conjunctiva/sclera: Conjunctivae normal.     Pupils: Pupils are equal, round, and reactive to light.  Cardiovascular:     Rate and Rhythm: Normal rate and regular rhythm.     Pulses: Normal pulses.     Heart sounds: Normal heart sounds.  Pulmonary:     Effort: Pulmonary effort is normal.     Breath sounds: Normal breath sounds.  Abdominal:     General: Bowel sounds are normal. There is no distension.     Palpations: Abdomen is soft. There is no mass.     Tenderness: There is no abdominal tenderness. There is no left CVA tenderness, guarding or rebound.     Hernia: No hernia is present.  Musculoskeletal:         General: Normal range of motion.     Cervical back: Normal range of motion and neck supple.  Lymphadenopathy:     Cervical: No cervical adenopathy.  Skin:    General: Skin is warm and dry.     Capillary Refill: Capillary refill takes less than 2 seconds.  Neurological:     General: No focal deficit present.     Mental Status: She is alert and oriented to person, place, and time.  Psychiatric:        Mood and Affect: Mood normal.        Behavior: Behavior normal.        Thought Content: Thought content normal.        Judgment: Judgment normal.     Assessment & Plan    1. Essential hypertension Stable.  Continue hydrochlorothiazide 25 mg tablets daily. - hydrochlorothiazide (HYDRODIURIL) 25 MG tablet; Take 1 tablet (25 mg total) by mouth daily.  Dispense: 90 tablet; Refill: 3  2. Irritable bowel syndrome with constipation Trial of Trulance 3 mg daily.  New prescription sent to her pharmacy.  Advise she have proper fiber and water intake daily.  Reassess at next visit. - Plecanatide (TRULANCE) 3 MG TABS; Take 3 mg by mouth daily.  Dispense: 30 tablet; Refill: 3  3. Acne vulgaris Clindamycin 1% solution may be used daily as needed.  - clindamycin (CLEOCIN T) 1 % external solution; Apply topically daily as needed.  Dispense: 30 mL; Refill: 2  4. BMI 40.0-44.9, adult [facility]) New prescription for Wegovy 0.25 mg weekly sent to her pharmacy. Discussed lowering calorie intake to 1500 calories per day and incorporating exercise into daily routine to help lose weight.  - Semaglutide-Weight Management (WEGOVY) 0.25 MG/0.5ML SOAJ; Inject 0.25 mg into the skin once a week.  Dispense: 2 mL; Refill: 2    Problem List Items Addressed This Visit       Cardiovascular and Mediastinum   Essential hypertension - Primary   Relevant Medications   hydrochlorothiazide (HYDRODIURIL) 25 MG tablet     Digestive   Irritable bowel syndrome with constipation   Relevant Medications   Plecanatide (TRULANCE)  3 MG TABS     Musculoskeletal and Integument   Acne vulgaris   Relevant Medications   clindamycin (CLEOCIN T) 1 % external solution     Other   BMI 40.0-44.9, adult (HCC)   Relevant Medications   Semaglutide-Weight Management (WEGOVY) 0.25 MG/0.5ML SOAJ     Return in about 6 weeks (around 02/25/2022) for routine - weight management - started wegovy. Marland Kitchen         Carlean Jews, NP  [facility] at [facility] [phone] (phone) [phone] (fax)  [facility]

## 2022-01-14 ENCOUNTER — Ambulatory Visit (INDEPENDENT_AMBULATORY_CARE_PROVIDER_SITE_OTHER): Payer: BLUE CROSS/BLUE SHIELD | Admitting: Nurse Practitioner

## 2022-01-14 ENCOUNTER — Encounter: Payer: Self-pay | Admitting: Nurse Practitioner

## 2022-01-14 VITALS — BP 128/84 | HR 89 | Ht 62.99 in | Wt 230.0 lb

## 2022-01-14 DIAGNOSIS — I10 Essential (primary) hypertension: Secondary | ICD-10-CM | POA: Diagnosis not present

## 2022-01-14 DIAGNOSIS — K581 Irritable bowel syndrome with constipation: Secondary | ICD-10-CM

## 2022-01-14 DIAGNOSIS — L700 Acne vulgaris: Secondary | ICD-10-CM | POA: Diagnosis not present

## 2022-01-14 DIAGNOSIS — Z6841 Body Mass Index (BMI) 40.0 and over, adult: Secondary | ICD-10-CM | POA: Diagnosis not present

## 2022-01-14 MED ORDER — HYDROCHLOROTHIAZIDE 25 MG PO TABS: 25.0000 mg | ORAL_TABLET | Freq: Every day | ORAL | 3 refills | Status: DC

## 2022-01-14 MED ORDER — WEGOVY 0.25 MG/0.5ML ~~LOC~~ SOAJ: 0.2500 mg | SUBCUTANEOUS | 2 refills | Status: DC

## 2022-01-14 MED ORDER — TRULANCE 3 MG PO TABS: 3.0000 mg | ORAL_TABLET | Freq: Every day | ORAL | 3 refills | Status: DC

## 2022-01-14 MED ORDER — CLINDAMYCIN PHOSPHATE 1 % EX SOLN: Freq: Every day | CUTANEOUS | 2 refills | Status: AC | PRN

## 2022-01-16 ENCOUNTER — Ambulatory Visit: Payer: 59 | Admitting: Nurse Practitioner

## 2022-01-18 ENCOUNTER — Encounter: Payer: Self-pay | Admitting: Nurse Practitioner

## 2022-01-27 DIAGNOSIS — L700 Acne vulgaris: Secondary | ICD-10-CM | POA: Insufficient documentation

## 2022-02-25 ENCOUNTER — Ambulatory Visit: Payer: BLUE CROSS/BLUE SHIELD | Admitting: Nurse Practitioner

## 2022-03-11 ENCOUNTER — Encounter: Payer: Self-pay | Admitting: Nurse Practitioner

## 2022-03-11 ENCOUNTER — Other Ambulatory Visit: Payer: Self-pay | Admitting: Nurse Practitioner

## 2022-03-11 DIAGNOSIS — I10 Essential (primary) hypertension: Secondary | ICD-10-CM

## 2022-03-11 DIAGNOSIS — K581 Irritable bowel syndrome with constipation: Secondary | ICD-10-CM

## 2022-03-11 MED ORDER — HYDROCHLOROTHIAZIDE 25 MG PO TABS: 25.0000 mg | ORAL_TABLET | Freq: Every day | ORAL | 3 refills | Status: DC

## 2022-03-11 MED ORDER — TRULANCE 3 MG PO TABS: 3.0000 mg | ORAL_TABLET | Freq: Every day | ORAL | 3 refills | Status: DC

## 2022-03-22 ENCOUNTER — Encounter: Payer: Self-pay | Admitting: *Deleted

## 2022-03-22 NOTE — Progress Notes (Signed)
Pt seeing PCP Heather NP at [facility] and has received regular f/u care since the 11/10/21 event;  the most recnt visit was on 01/14/22, with ongoing communications noted to date. No additional health equity support indicated at this time.

## 2022-03-25 ENCOUNTER — Ambulatory Visit: Payer: 59 | Admitting: Nurse Practitioner

## 2022-05-09 ENCOUNTER — Ambulatory Visit: Payer: Self-pay | Admitting: Nurse Practitioner

## 2022-06-23 ENCOUNTER — Other Ambulatory Visit: Payer: Self-pay | Admitting: Nurse Practitioner

## 2022-06-23 DIAGNOSIS — Z3042 Encounter for surveillance of injectable contraceptive: Secondary | ICD-10-CM

## 2022-08-23 ENCOUNTER — Ambulatory Visit: Payer: 59 | Admitting: Nurse Practitioner

## 2022-08-23 ENCOUNTER — Encounter: Payer: Self-pay | Admitting: Nurse Practitioner

## 2022-08-23 VITALS — BP 140/102 | HR 79 | Ht 62.99 in | Wt 228.4 lb

## 2022-08-23 DIAGNOSIS — E785 Hyperlipidemia, unspecified: Secondary | ICD-10-CM

## 2022-08-23 DIAGNOSIS — R7301 Impaired fasting glucose: Secondary | ICD-10-CM

## 2022-08-23 DIAGNOSIS — Z3042 Encounter for surveillance of injectable contraceptive: Secondary | ICD-10-CM

## 2022-08-23 DIAGNOSIS — E559 Vitamin D deficiency, unspecified: Secondary | ICD-10-CM

## 2022-08-23 DIAGNOSIS — Z1231 Encounter for screening mammogram for malignant neoplasm of breast: Secondary | ICD-10-CM

## 2022-08-23 DIAGNOSIS — R635 Abnormal weight gain: Secondary | ICD-10-CM

## 2022-08-23 DIAGNOSIS — R5383 Other fatigue: Secondary | ICD-10-CM

## 2022-08-23 DIAGNOSIS — I10 Essential (primary) hypertension: Secondary | ICD-10-CM

## 2022-08-23 MED ORDER — MEDROXYPROGESTERONE ACETATE 150 MG/ML IM SUSY
150.0000 mg | PREFILLED_SYRINGE | INTRAMUSCULAR | 3 refills | Status: AC
Start: 2022-08-23 — End: ?

## 2022-08-23 MED ORDER — HYDROCHLOROTHIAZIDE 25 MG PO TABS
25.0000 mg | ORAL_TABLET | Freq: Every day | ORAL | 3 refills | Status: DC
Start: 2022-08-23 — End: 2022-12-27

## 2022-08-23 NOTE — Progress Notes (Signed)
Established patient visit   Patient: Helen Clark   DOB: 01-03-1976   47 y.o. Female  MRN: [id number] Visit Date: 08/23/2022   Chief Complaint  Patient presents with   Advice Only    BP and Sleep Apnea questions   Subjective    HPI HPI     Advice Only    Additional comments: BP and Sleep Apnea questions      Last edited by Junita Push, Thurnell Garbe, CMA on 08/23/2022  8:15 AM.      Follow up  -HCTZ --blood pressure slightly elevated today  --states that it is generally well controlled.  Falling asleep ok -wakes up several times every night to urinate -feels good upon waking denies excess daytime sleepiness.    Medications: Outpatient Medications Prior to Visit  Medication Sig   acetaminophen (TYLENOL) 500 MG tablet Take 1,000 mg by mouth every 6 (six) hours as needed (for pain.).   clindamycin (CLEOCIN T) 1 % external solution Apply topically daily as needed.   Multiple Vitamin (MULTIVITAMIN WITH MINERALS) TABS tablet Take 1 tablet by mouth every evening.   [DISCONTINUED] hydrochlorothiazide (HYDRODIURIL) 25 MG tablet Take 1 tablet (25 mg total) by mouth daily.   [DISCONTINUED] medroxyPROGESTERone Acetate 150 MG/ML SUSY Inject 1 mL (150 mg total) into the muscle every 3 (three) months.   [DISCONTINUED] Plecanatide (TRULANCE) 3 MG TABS Take 3 mg by mouth daily.   [DISCONTINUED] Semaglutide-Weight Management (WEGOVY) 0.25 MG/0.5ML SOAJ Inject 0.25 mg into the skin once a week.   No facility-administered medications prior to visit.    Review of Systems See HPI       Objective     Today's Vitals   08/23/22 0816 08/23/22 0855  BP: (Abnormal) 141/101 (Abnormal) 140/102  Pulse: 79   SpO2: 97%   Weight: 228 lb 6.4 oz (103.6 kg)   Height: 5' 2.99" (1.6 m)    Body mass index is 40.47 kg/m.  BP Readings from Last 3 Encounters:  08/23/22 (Abnormal) 140/102  01/14/22 128/84  11/21/21 132/88    Wt Readings from Last 3 Encounters:  08/23/22 228 lb 6.4 oz  (103.6 kg)  01/14/22 230 lb (104.3 kg)  11/21/21 226 lb 12.8 oz (102.9 kg)    Physical Exam Vitals and nursing note reviewed.  Constitutional:      Appearance: Normal appearance. She is well-developed.  HENT:     Head: Normocephalic and atraumatic.     Nose: Nose normal.     Mouth/Throat:     Mouth: Mucous membranes are moist.     Pharynx: Oropharynx is clear.  Eyes:     Extraocular Movements: Extraocular movements intact.     Conjunctiva/sclera: Conjunctivae normal.     Pupils: Pupils are equal, round, and reactive to light.  Neck:     Vascular: No carotid bruit.  Cardiovascular:     Rate and Rhythm: Normal rate and regular rhythm.     Pulses: Normal pulses.     Heart sounds: Normal heart sounds.  Pulmonary:     Effort: Pulmonary effort is normal.     Breath sounds: Normal breath sounds.  Abdominal:     Palpations: Abdomen is soft.  Musculoskeletal:        General: Normal range of motion.     Cervical back: Normal range of motion and neck supple.  Lymphadenopathy:     Cervical: No cervical adenopathy.  Skin:    General: Skin is warm and dry.     Capillary Refill: Capillary refill  takes less than 2 seconds.  Neurological:     General: No focal deficit present.     Mental Status: She is alert and oriented to person, place, and time.  Psychiatric:        Mood and Affect: Mood normal.        Behavior: Behavior normal.        Thought Content: Thought content normal.        Judgment: Judgment normal.      Assessment & Plan    Essential hypertension -     hydroCHLOROthiazide; Take 1 tablet (25 mg total) by mouth daily.  Dispense: 90 tablet; Refill: 3  Other fatigue -     TSH + free T4; Future -     VITAMIN D 25 Hydroxy (Vit-D Deficiency, Fractures); Future -     CBC; Future -     Cortisol; Future  Abnormal weight gain -     TSH + free T4; Future -     Cortisol; Future  Impaired fasting glucose -     Hemoglobin A1c; Future  Vitamin D deficiency -     VITAMIN  D 25 Hydroxy (Vit-D Deficiency, Fractures); Future  Dyslipidemia, goal LDL below 100 -     Lipid panel; Future -     Comprehensive metabolic panel; Future  Surveillance of contraceptive injection -     medroxyPROGESTERone Acetate; Inject 1 mL (150 mg total) into the muscle every 3 (three) months.  Dispense: 1 mL; Refill: 3  Encounter for screening mammogram for malignant neoplasm of breast -     3D Screening Mammogram, Left and Right; Future     Return in about 4 months (around 12/23/2022) for health maintenance exam, blood pressure.         Carlean Jews, NP  [facility] [phone] (phone) [phone] (fax)  [facility]

## 2022-08-24 LAB — COMPREHENSIVE METABOLIC PANEL
ALT: 38 IU/L — ABNORMAL HIGH (ref 0–32)
AST: 23 IU/L (ref 0–40)
Albumin/Globulin Ratio: 1.6 (ref 1.2–2.2)
Albumin: 4.3 g/dL (ref 3.9–4.9)
Alkaline Phosphatase: 40 IU/L — ABNORMAL LOW (ref 44–121)
BUN/Creatinine Ratio: 14 (ref 9–23)
BUN: 17 mg/dL (ref 6–24)
Bilirubin Total: 1 mg/dL (ref 0.0–1.2)
CO2: 20 mmol/L (ref 20–29)
Calcium: 9.7 mg/dL (ref 8.7–10.2)
Chloride: 102 mmol/L (ref 96–106)
Creatinine, Ser: 1.18 mg/dL — ABNORMAL HIGH (ref 0.57–1.00)
Globulin, Total: 2.7 g/dL (ref 1.5–4.5)
Glucose: 83 mg/dL (ref 70–99)
Potassium: 3.8 mmol/L (ref 3.5–5.2)
Sodium: 138 mmol/L (ref 134–144)
Total Protein: 7 g/dL (ref 6.0–8.5)
eGFR: 58 mL/min/{1.73_m2} — ABNORMAL LOW (ref >59)

## 2022-08-24 LAB — HEMOGLOBIN A1C
Est. average glucose Bld gHb Est-mCnc: 111 mg/dL
Hgb A1c MFr Bld: 5.5 % (ref 4.8–5.6)

## 2022-08-24 LAB — CBC
Hematocrit: 51 % — ABNORMAL HIGH (ref 34.0–46.6)
Hemoglobin: 17 g/dL — ABNORMAL HIGH (ref 11.1–15.9)
MCH: 28.6 pg (ref 26.6–33.0)
MCHC: 33.3 g/dL (ref 31.5–35.7)
MCV: 86 fL (ref 79–97)
Platelets: 284 10*3/uL (ref 150–450)
RBC: 5.95 x10E6/uL — ABNORMAL HIGH (ref 3.77–5.28)
RDW: 13.1 % (ref 11.7–15.4)
WBC: 3.7 10*3/uL (ref 3.4–10.8)

## 2022-08-24 LAB — LIPID PANEL
Chol/HDL Ratio: 2.8 ratio (ref 0.0–4.4)
Cholesterol, Total: 120 mg/dL (ref 100–199)
HDL: 43 mg/dL (ref >39)
LDL Chol Calc (NIH): 67 mg/dL (ref 0–99)
Triglycerides: 37 mg/dL (ref 0–149)
VLDL Cholesterol Cal: 10 mg/dL (ref 5–40)

## 2022-08-24 LAB — TSH+FREE T4
Free T4: 1.51 ng/dL (ref 0.82–1.77)
TSH: 2.02 u[IU]/mL (ref 0.450–4.500)

## 2022-08-24 LAB — VITAMIN D 25 HYDROXY (VIT D DEFICIENCY, FRACTURES): Vit D, 25-Hydroxy: 35.4 ng/mL (ref 30.0–100.0)

## 2022-08-24 LAB — CORTISOL: Cortisol: 8.8 ug/dL (ref 6.2–19.4)

## 2022-09-22 NOTE — Assessment & Plan Note (Signed)
Check thyroid panel with routine, fasting labs.

## 2022-09-22 NOTE — Assessment & Plan Note (Signed)
Check routine labs for further evaluation.  

## 2022-09-22 NOTE — Assessment & Plan Note (Signed)
Continue Depo-Provera injection every 3 months.

## 2022-09-22 NOTE — Assessment & Plan Note (Signed)
Check vitamin d level and treat deficiency as indicated.   

## 2022-09-22 NOTE — Assessment & Plan Note (Signed)
Blood pressure elevated today. -On HCTZ 25 mg daily. -Limit salt and increase water in the diet. -Encouraged her to monitor blood pressure at home. -She understands that goal is to help blood pressure of 130/80 or better.

## 2022-09-22 NOTE — Assessment & Plan Note (Signed)
Check fasting lipids and treat high cholesterol as indicated.  

## 2022-09-22 NOTE — Assessment & Plan Note (Signed)
Hemoglobin A1c with routine fasting labs.

## 2022-10-29 ENCOUNTER — Telehealth: Payer: Self-pay

## 2022-10-29 NOTE — Telephone Encounter (Signed)
Pt is wanting the results of her labs reviewed and discuss with her from April.

## 2022-10-31 NOTE — Telephone Encounter (Signed)
Please let the patient know that her labs show a little bit of dehydration. She should increase her water intake. We can recheck her cmp at her next visit. Also, her hemoglobin and hematocrit were a bit elevated. This is stable from last year. Has she ever been diagnosed with sleep apnea? This may be something we need to address in the future. All of her other labs were normal.  Thanks so much.   -HB

## 2022-10-31 NOTE — Telephone Encounter (Signed)
Advised pt of result note. Pt is wanting to have a sleep study done.

## 2022-11-01 ENCOUNTER — Other Ambulatory Visit: Payer: Self-pay | Admitting: Nurse Practitioner

## 2022-11-01 DIAGNOSIS — G4730 Sleep apnea, unspecified: Secondary | ICD-10-CM

## 2022-11-01 DIAGNOSIS — D582 Other hemoglobinopathies: Secondary | ICD-10-CM

## 2022-11-01 NOTE — Progress Notes (Signed)
Order placed for sleep study due to elevated Hemoglobin, hematocrit, and possible sleep apnea.

## 2022-11-01 NOTE — Progress Notes (Signed)
Patient notified of labs. Referral made for sleep studies.

## 2022-11-01 NOTE — Telephone Encounter (Signed)
Order placed for sleep study due to elevated Hemoglobin, hematocrit, and possible sleep apnea.  

## 2022-11-01 NOTE — Telephone Encounter (Signed)
Ok thanks 

## 2022-12-27 ENCOUNTER — Ambulatory Visit (INDEPENDENT_AMBULATORY_CARE_PROVIDER_SITE_OTHER): Payer: 59 | Admitting: Family Medicine

## 2022-12-27 ENCOUNTER — Encounter: Payer: Self-pay | Admitting: Family Medicine

## 2022-12-27 ENCOUNTER — Ambulatory Visit: Payer: No Typology Code available for payment source | Admitting: Nurse Practitioner

## 2022-12-27 VITALS — BP 144/98 | HR 79 | Resp 18 | Ht 62.99 in | Wt 229.0 lb

## 2022-12-27 DIAGNOSIS — G4700 Insomnia, unspecified: Secondary | ICD-10-CM

## 2022-12-27 DIAGNOSIS — N289 Disorder of kidney and ureter, unspecified: Secondary | ICD-10-CM

## 2022-12-27 DIAGNOSIS — I10 Essential (primary) hypertension: Secondary | ICD-10-CM

## 2022-12-27 DIAGNOSIS — Z0000 Encounter for general adult medical examination without abnormal findings: Secondary | ICD-10-CM

## 2022-12-27 DIAGNOSIS — Z1159 Encounter for screening for other viral diseases: Secondary | ICD-10-CM

## 2022-12-27 DIAGNOSIS — E785 Hyperlipidemia, unspecified: Secondary | ICD-10-CM | POA: Diagnosis not present

## 2022-12-27 DIAGNOSIS — E559 Vitamin D deficiency, unspecified: Secondary | ICD-10-CM

## 2022-12-27 MED ORDER — HYDROCHLOROTHIAZIDE 25 MG PO TABS
25.0000 mg | ORAL_TABLET | Freq: Every day | ORAL | 3 refills | Status: DC
Start: 2022-12-27 — End: 2022-12-31

## 2022-12-27 NOTE — Patient Instructions (Addendum)
Let me know if you would like a referral to see a nutritionist for some extra ideas in the future.  I also really love a podcast called The Obesity Guide with Matthea Rentea.  It is very informative and empowering and has a lot of great information if you check it out!  In general, I recommend the following goals for all women: -At least 64 oz fluid intake each day -At least 90 g protein each day -At least 25 g fiber each day

## 2022-12-27 NOTE — Assessment & Plan Note (Signed)
On last check, creatinine 1.18 and EGFR 58.  Repeating CMP for kidney function today and will adjust management accordingly.  It will be important to maintain good control of blood pressure.

## 2022-12-27 NOTE — Assessment & Plan Note (Signed)
Patient states that insurance will not cover any weight management medication, both injectable GLP-1's as well as generic oral options.  We discussed that other options may include referral to a nutritionist, Healthy Weight and Wellness clinic, or connecting to other resources.  Patient would like to try on her own for now but may reach out at any time if she would like a referral.

## 2022-12-27 NOTE — Assessment & Plan Note (Signed)
Ordering home sleep study for insomnia, particularly given risk factor of obesity with BMI 40.58.  Will adjust management according to results as needed.

## 2022-12-27 NOTE — Progress Notes (Signed)
Complete physical exam  Patient: Helen Clark   DOB: 04-21-1976   47 y.o. Female  MRN: [id number]  Subjective:    Chief Complaint  Patient presents with   Annual Exam    Fasting    Helen Clark is a 47 y.o. female who presents today for a complete physical exam. She reports consuming a  high protein, low carb  diet.  She generally feels well. She reports sleeping poorly, but a home sleep test was never done. She is working on [employer], but her insurance will not cover any weight loss medications so progress is very slow and difficult.   Most recent fall risk assessment:    12/27/2022    8:53 AM  Fall Risk   Falls in the past year? 0  Number falls in past yr: 0  Injury with Fall? 0  Risk for fall due to : No Fall Risks  Follow up Falls evaluation completed     Most recent depression and anxiety screenings:    12/27/2022    8:53 AM 08/23/2022    8:21 AM  PHQ 2/9 Scores  PHQ - 2 Score 0 0  PHQ- 9 Score 0 3      01/14/2022    3:19 PM 11/21/2021    3:55 PM 06/27/2021   10:11 AM 12/11/2020    8:46 AM  GAD 7 : Generalized Anxiety Score  Nervous, Anxious, on Edge 0 0 0 0  Control/stop worrying 0 0 0 0  Worry too much - different things 0 0 0 0  Trouble relaxing 0 0 0 0  Restless 0 0 0 0  Easily annoyed or irritable 0 0 0 0  Afraid - awful might happen 0 0 0 0  Total GAD 7 Score 0 0 0 0    Patient Active Problem List   Diagnosis Date Noted   Insomnia 12/27/2022   Abnormal weight gain 08/23/2022   Vitamin D deficiency 08/23/2022   Impaired fasting glucose 08/23/2022   Dyslipidemia, goal LDL below 100 08/23/2022   Acne vulgaris 01/27/2022   Other fatigue 07/01/2021   Memory problem 12/11/2020   Irritable bowel syndrome with constipation 12/11/2020   Post traumatic stress disorder 09/25/2020   Abnormal renal function 08/15/2020   Hypertension 05/27/2017   Morbid obesity with BMI of 40.0-44.9, adult (HCC) 05/27/2017    Past Surgical History:  Procedure  Laterality Date   NO PAST SURGERIES     RADIOLOGY WITH ANESTHESIA N/A 01/02/2021   Procedure: MRI WITH ANESTHESIA BRAIN WITHOUT CONTRAST;  Surgeon: Radiologist, Medication, MD;  Location: [facility] OR;  Service: Radiology;  Laterality: N/A;   Social History   Tobacco Use   Smoking status: Never   Smokeless tobacco: Never  Substance Use Topics   Alcohol use: Never   Drug use: Never   Family History  Problem Relation Age of Onset   High blood pressure Mother    Stroke Mother    High Cholesterol Father    No Known Allergies   Patient Care Team: Melida Quitter, PA as PCP - General (Family Medicine)   Outpatient Medications Prior to Visit  Medication Sig   acetaminophen (TYLENOL) 500 MG tablet Take 1,000 mg by mouth every 6 (six) hours as needed (for pain.).   clindamycin (CLEOCIN T) 1 % external solution Apply topically daily as needed.   medroxyPROGESTERone Acetate 150 MG/ML SUSY Inject 1 mL (150 mg total) into the muscle every 3 (three) months.   Multiple Vitamin (  MULTIVITAMIN WITH MINERALS) TABS tablet Take 1 tablet by mouth every evening.   [DISCONTINUED] hydrochlorothiazide (HYDRODIURIL) 25 MG tablet Take 1 tablet (25 mg total) by mouth daily.   No facility-administered medications prior to visit.    Review of Systems  Constitutional:  Negative for chills, fever and malaise/fatigue.  HENT:  Negative for congestion and hearing loss.   Eyes:  Negative for blurred vision and double vision.  Respiratory:  Negative for cough and shortness of breath.   Cardiovascular:  Negative for chest pain, palpitations and leg swelling.  Gastrointestinal:  Negative for abdominal pain, constipation, diarrhea and heartburn.  Genitourinary:  Negative for frequency and urgency.  Musculoskeletal:  Negative for myalgias and neck pain.  Neurological:  Negative for headaches.  Endo/Heme/Allergies:  Negative for polydipsia.  Psychiatric/Behavioral:  Negative for depression. The patient has insomnia.  The patient is not nervous/anxious.       Objective:    BP (!) 144/98 (BP Location: Left Arm, Patient Position: Sitting, Cuff Size: Large)   Pulse 79   Resp 18   Ht 5' 2.99" (1.6 m)   Wt 229 lb (103.9 kg)   LMP  (LMP Unknown)   SpO2 97%   BMI 40.58 kg/m    Physical Exam Constitutional:      General: She is not in acute distress.    Appearance: Normal appearance.  HENT:     Head: Normocephalic and atraumatic.     Right Ear: Tympanic membrane, ear canal and external ear normal.     Left Ear: Tympanic membrane, ear canal and external ear normal.     Nose: Nose normal.     Mouth/Throat:     Mouth: Mucous membranes are moist.     Pharynx: No oropharyngeal exudate or posterior oropharyngeal erythema.  Eyes:     Extraocular Movements: Extraocular movements intact.     Conjunctiva/sclera: Conjunctivae normal.     Pupils: Pupils are equal, round, and reactive to light.     Comments: Wears contacts, up-to-date  Neck:     Thyroid: No thyroid mass, thyromegaly or thyroid tenderness.  Cardiovascular:     Rate and Rhythm: Normal rate and regular rhythm.     Heart sounds: Normal heart sounds. No murmur heard.    No friction rub. No gallop.  Pulmonary:     Effort: Pulmonary effort is normal. No respiratory distress.     Breath sounds: Normal breath sounds. No wheezing, rhonchi or rales.  Abdominal:     General: Abdomen is flat. Bowel sounds are normal. There is no distension.     Palpations: There is no mass.     Tenderness: There is no abdominal tenderness. There is no guarding.  Musculoskeletal:        General: Normal range of motion.     Cervical back: Normal range of motion and neck supple.  Lymphadenopathy:     Cervical: No cervical adenopathy.  Skin:    General: Skin is warm and dry.  Neurological:     Mental Status: She is alert and oriented to person, place, and time.     Cranial Nerves: No cranial nerve deficit.     Motor: No weakness.     Deep Tendon Reflexes:  Reflexes normal.  Psychiatric:        Mood and Affect: Mood normal.       Assessment & Plan:    Routine Health Maintenance and Physical Exam  Immunization History  Administered Date(s) Administered   PFIZER(Purple Top)SARS-COV-2 Vaccination 01/13/2020, 02/19/2020  Tdap 02/29/2020    Health Maintenance  Topic Date Due   HIV Screening  Never done   Hepatitis C Screening  Never done   COVID-19 Vaccine (3 - 2023-24 season) 01/04/2022   INFLUENZA VACCINE  08/04/2023 (Originally 12/05/2022)   PAP SMEAR-Modifier  10/03/2025   DTaP/Tdap/Td (2 - Td or Tdap) 02/28/2030   HPV VACCINES  Aged Out    Checking labs including CBC, CMP, lipid panel, A1C, TSH, and vitamin D.  Also agreeable to updating HIV and hepatitis C screenings today.  Discussed health benefits of physical activity, and encouraged her to engage in regular exercise appropriate for her age and condition.  Wellness examination  Essential hypertension Assessment & Plan: BP goal <130/80.  Blood pressure elevated in the office today.  Will wait for results of CMP before adjusting medication regimen.  Continue hydrochlorothiazide 25 mg daily.  Consider adding valsartan, start combination pill to simplify pill regimen.  Decreasing blood pressure should also improve kidney function.  Will continue to monitor.  Orders: -     CBC with Differential/Platelet; Future -     Comprehensive metabolic panel; Future -     hydroCHLOROthiazide; Take 1 tablet (25 mg total) by mouth daily.  Dispense: 90 tablet; Refill: 3  Dyslipidemia, goal LDL below 100 Assessment & Plan: Last lipid panel: LDL 67, HDL 43, triglycerides 37.  Will continue to monitor.  Orders: -     CBC with Differential/Platelet; Future -     Comprehensive metabolic panel; Future -     Lipid panel; Future  Obesity, Class III, BMI 40-49.9 (morbid obesity) (HCC) Assessment & Plan: Patient states that insurance will not cover any weight management medication, both  injectable GLP-1's as well as generic oral options.  We discussed that other options may include referral to a nutritionist, Healthy Weight and Wellness clinic, or connecting to other resources.  Patient would like to try on her own for now but may reach out at any time if she would like a referral.  Orders: -     CBC with Differential/Platelet; Future -     Comprehensive metabolic panel; Future -     Hemoglobin A1c; Future -     Lipid panel; Future -     TSH Rfx on Abnormal to Free T4; Future -     Home sleep test; Future  Insomnia, unspecified type Assessment & Plan: Ordering home sleep study for insomnia, particularly given risk factor of obesity with BMI 40.58.  Will adjust management according to results as needed.  Orders: -     Home sleep test; Future  Abnormal renal function Assessment & Plan: On last check, creatinine 1.18 and EGFR 58.  Repeating CMP for kidney function today and will adjust management accordingly.  It will be important to maintain good control of blood pressure.   Vitamin D deficiency -     VITAMIN D 25 Hydroxy (Vit-D Deficiency, Fractures); Future  Screening for viral disease -     Hepatitis C antibody; Future -     HIV Antibody (routine testing w rflx); Future    Return in about 6 months (around 06/29/2023) for follow-up for HTN, HLD, fasting blood work 1 week before.     Melida Quitter, PA

## 2022-12-27 NOTE — Assessment & Plan Note (Signed)
Last lipid panel: LDL 67, HDL 43, triglycerides 37.  Will continue to monitor.

## 2022-12-27 NOTE — Assessment & Plan Note (Addendum)
BP goal <130/80.  Blood pressure elevated in the office today.  Will wait for results of CMP before adjusting medication regimen.  Continue hydrochlorothiazide 25 mg daily.  Consider adding valsartan, start combination pill to simplify pill regimen.  Decreasing blood pressure should also improve kidney function.  Will continue to monitor.

## 2022-12-28 LAB — HIV ANTIBODY (ROUTINE TESTING W REFLEX): HIV Screen 4th Generation wRfx: NONREACTIVE

## 2022-12-28 LAB — COMPREHENSIVE METABOLIC PANEL
ALT: 27 IU/L (ref 0–32)
AST: 24 IU/L (ref 0–40)
Albumin: 4.1 g/dL (ref 3.9–4.9)
Alkaline Phosphatase: 43 IU/L — ABNORMAL LOW (ref 44–121)
BUN/Creatinine Ratio: 13 (ref 9–23)
BUN: 15 mg/dL (ref 6–24)
Bilirubin Total: 0.8 mg/dL (ref 0.0–1.2)
CO2: 21 mmol/L (ref 20–29)
Calcium: 9.2 mg/dL (ref 8.7–10.2)
Chloride: 103 mmol/L (ref 96–106)
Creatinine, Ser: 1.18 mg/dL — ABNORMAL HIGH (ref 0.57–1.00)
Globulin, Total: 2.9 g/dL (ref 1.5–4.5)
Glucose: 87 mg/dL (ref 70–99)
Potassium: 3.9 mmol/L (ref 3.5–5.2)
Sodium: 141 mmol/L (ref 134–144)
Total Protein: 7 g/dL (ref 6.0–8.5)
eGFR: 58 mL/min/{1.73_m2} — ABNORMAL LOW (ref >59)

## 2022-12-28 LAB — CBC WITH DIFFERENTIAL/PLATELET
Basophils Absolute: 0 10*3/uL (ref 0.0–0.2)
Basos: 1 %
EOS (ABSOLUTE): 0.1 10*3/uL (ref 0.0–0.4)
Eos: 3 %
Hematocrit: 49.4 % — ABNORMAL HIGH (ref 34.0–46.6)
Hemoglobin: 16.1 g/dL — ABNORMAL HIGH (ref 11.1–15.9)
Immature Grans (Abs): 0 10*3/uL (ref 0.0–0.1)
Immature Granulocytes: 0 %
Lymphocytes Absolute: 2 10*3/uL (ref 0.7–3.1)
Lymphs: 55 %
MCH: 28 pg (ref 26.6–33.0)
MCHC: 32.6 g/dL (ref 31.5–35.7)
MCV: 86 fL (ref 79–97)
Monocytes Absolute: 0.3 10*3/uL (ref 0.1–0.9)
Monocytes: 9 %
Neutrophils Absolute: 1.2 10*3/uL — ABNORMAL LOW (ref 1.4–7.0)
Neutrophils: 32 %
Platelets: 285 10*3/uL (ref 150–450)
RBC: 5.75 x10E6/uL — ABNORMAL HIGH (ref 3.77–5.28)
RDW: 12.9 % (ref 11.7–15.4)
WBC: 3.6 10*3/uL (ref 3.4–10.8)

## 2022-12-28 LAB — LIPID PANEL
Chol/HDL Ratio: 2.3 ratio (ref 0.0–4.4)
Cholesterol, Total: 115 mg/dL (ref 100–199)
HDL: 50 mg/dL (ref >39)
LDL Chol Calc (NIH): 54 mg/dL (ref 0–99)
Triglycerides: 45 mg/dL (ref 0–149)
VLDL Cholesterol Cal: 11 mg/dL (ref 5–40)

## 2022-12-28 LAB — HEPATITIS C ANTIBODY: Hep C Virus Ab: NONREACTIVE

## 2022-12-28 LAB — HEMOGLOBIN A1C
Est. average glucose Bld gHb Est-mCnc: 114 mg/dL
Hgb A1c MFr Bld: 5.6 % (ref 4.8–5.6)

## 2022-12-28 LAB — VITAMIN D 25 HYDROXY (VIT D DEFICIENCY, FRACTURES): Vit D, 25-Hydroxy: 40.9 ng/mL (ref 30.0–100.0)

## 2022-12-28 LAB — TSH RFX ON ABNORMAL TO FREE T4: TSH: 2.08 u[IU]/mL (ref 0.450–4.500)

## 2022-12-31 ENCOUNTER — Encounter: Payer: Self-pay | Admitting: Family Medicine

## 2022-12-31 DIAGNOSIS — I10 Essential (primary) hypertension: Secondary | ICD-10-CM

## 2022-12-31 MED ORDER — VALSARTAN-HYDROCHLOROTHIAZIDE 160-25 MG PO TABS
1.0000 | ORAL_TABLET | Freq: Every day | ORAL | 1 refills | Status: DC
Start: 2022-12-31 — End: 2023-06-26

## 2022-12-31 NOTE — Addendum Note (Signed)
Addended by: Saralyn Pilar on: 12/31/2022 02:49 PM   Modules accepted: Orders

## 2023-01-31 ENCOUNTER — Ambulatory Visit (HOSPITAL_BASED_OUTPATIENT_CLINIC_OR_DEPARTMENT_OTHER): Payer: 59 | Attending: Family Medicine | Admitting: Internal Medicine

## 2023-03-01 ENCOUNTER — Ambulatory Visit
Admission: RE | Admit: 2023-03-01 | Discharge: 2023-03-01 | Disposition: A | Discharge status: Home / Self Care | Payer: 59 | Source: Ambulatory Visit | Attending: Family Medicine | Admitting: Family Medicine

## 2023-03-01 ENCOUNTER — Other Ambulatory Visit: Payer: Self-pay | Admitting: Family Medicine

## 2023-03-01 DIAGNOSIS — Z1231 Encounter for screening mammogram for malignant neoplasm of breast: Secondary | ICD-10-CM

## 2023-04-08 ENCOUNTER — Encounter: Payer: Self-pay | Admitting: Family Medicine

## 2023-04-08 DIAGNOSIS — R7301 Impaired fasting glucose: Secondary | ICD-10-CM

## 2023-04-08 MED ORDER — METFORMIN HCL 500 MG PO TABS
500.0000 mg | ORAL_TABLET | Freq: Two times a day (BID) | ORAL | 3 refills | Status: AC
Start: 2023-04-08 — End: ?

## 2023-04-08 NOTE — Addendum Note (Signed)
Addended by: Saralyn Pilar on: 04/08/2023 03:46 PM   Modules accepted: Orders

## 2023-04-18 IMAGING — MR MR HEAD W/O CM
6 of 10 series · 28 of 48 positions shown · non-contrast
Comparison: No pertinent prior exams available for comparison.

CLINICAL DATA: Post concussive syndrome 9TT.G0 (PRA-WX-CM). Memory
Loss; Head trauma, focal neuro findings (Age 19-64y). Dizziness,
non-specific; Dizziness, persistent/recurrent, cardiac or vascular
cause suspected. Traumatic injury of head, subsequent encounter
XV0.0VZS (PRA-WX-CM). Memory problem U6M.M (PRA-WX-CM).

EXAM:
MRI HEAD WITHOUT CONTRAST
TECHNIQUE: Multiplanar, multiecho pulse sequences of the brain and surrounding
structures were obtained without intravenous contrast.

[Series 2: DWI · axial · 3.0mm · 0.94mm/px · z∈[-89,+41]mm · 8 of 90 slices shown (1 of 2)]
[im 1/90]
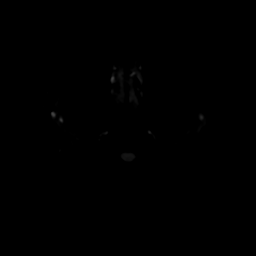
[im 10/90]
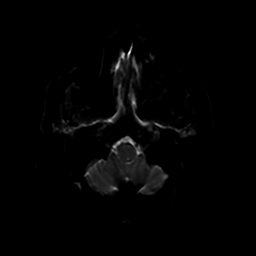
[im 30/90]
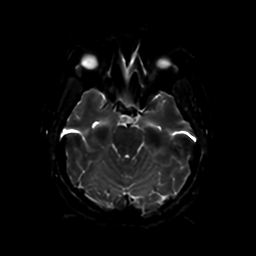
[im 40/90]
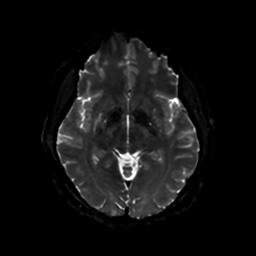
[im 50/90]
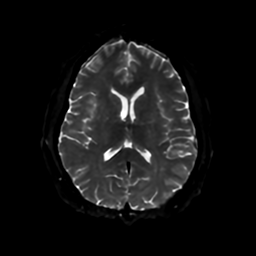
[im 60/90]
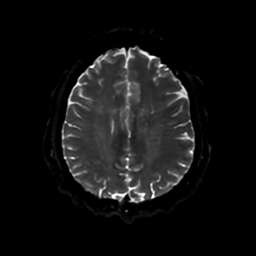
[im 80/90]
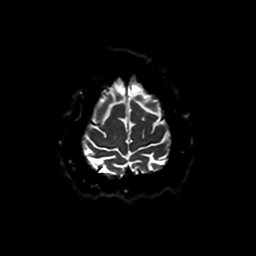
[im 90/90]
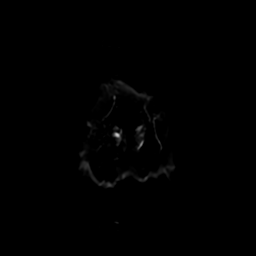

[Series 3: DWI · coronal · 4.0mm · 0.94mm/px · 8 of 70 slices shown (2 of 2)]
[im 1/70]
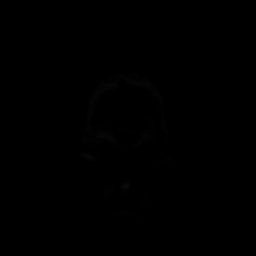
[im 10/70]
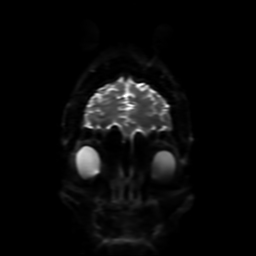
[im 20/70]
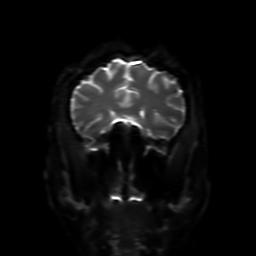
[im 30/70]
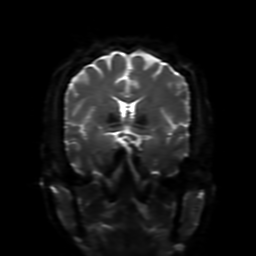
[im 40/70]
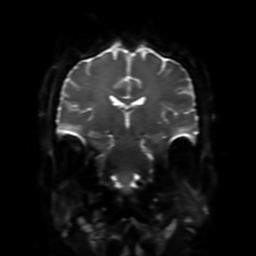
[im 50/70]
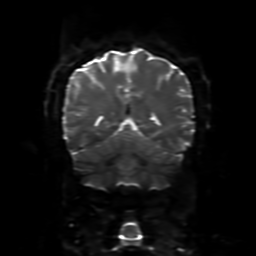
[im 60/70]
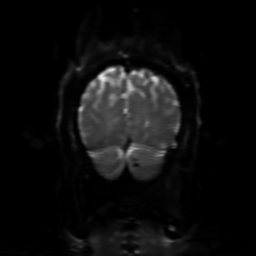
[im 70/70]
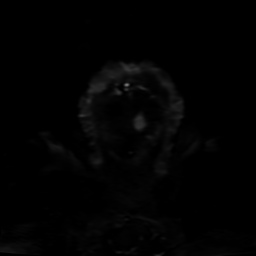

[Series 4: FLAIR · sagittal · 5.0mm · 0.23mm/px · 2 of 23 slices shown (1 of 2)]
[im 1/23]
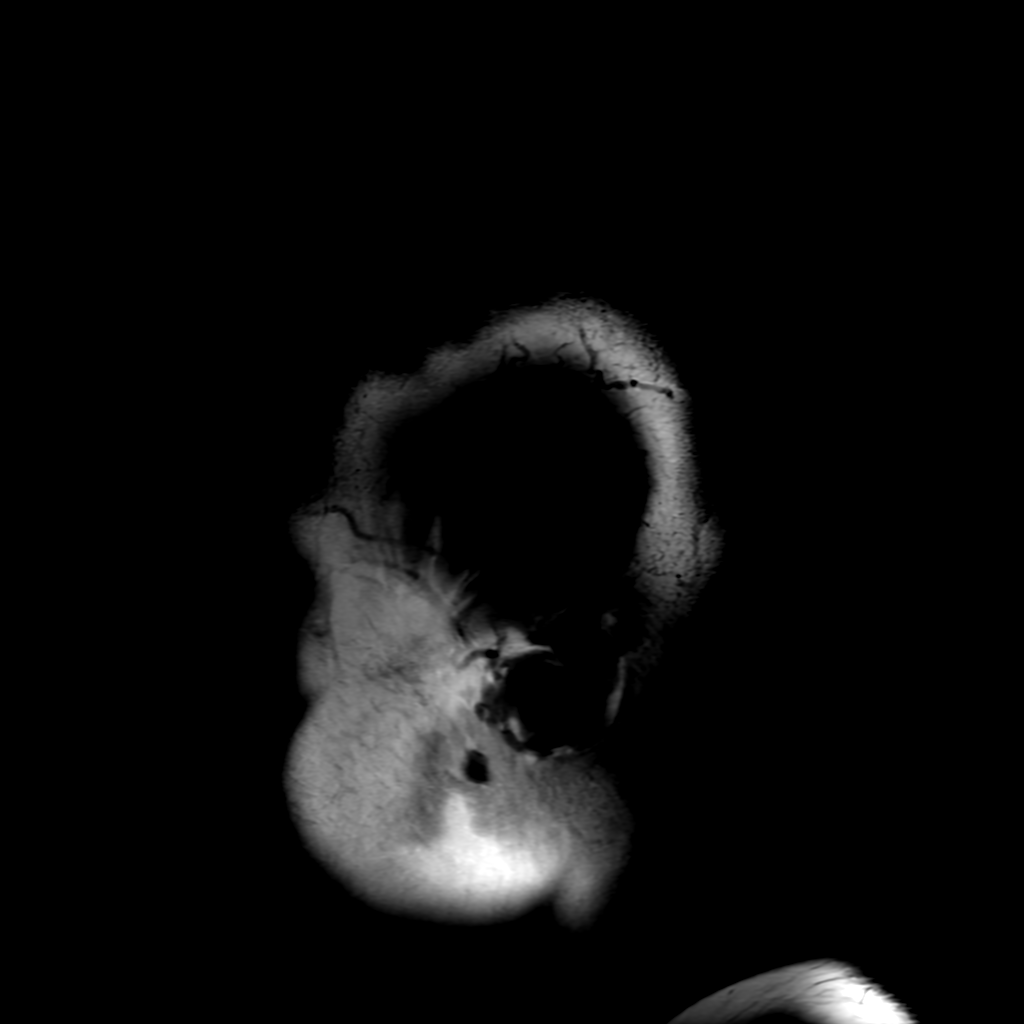
[im 23/23]
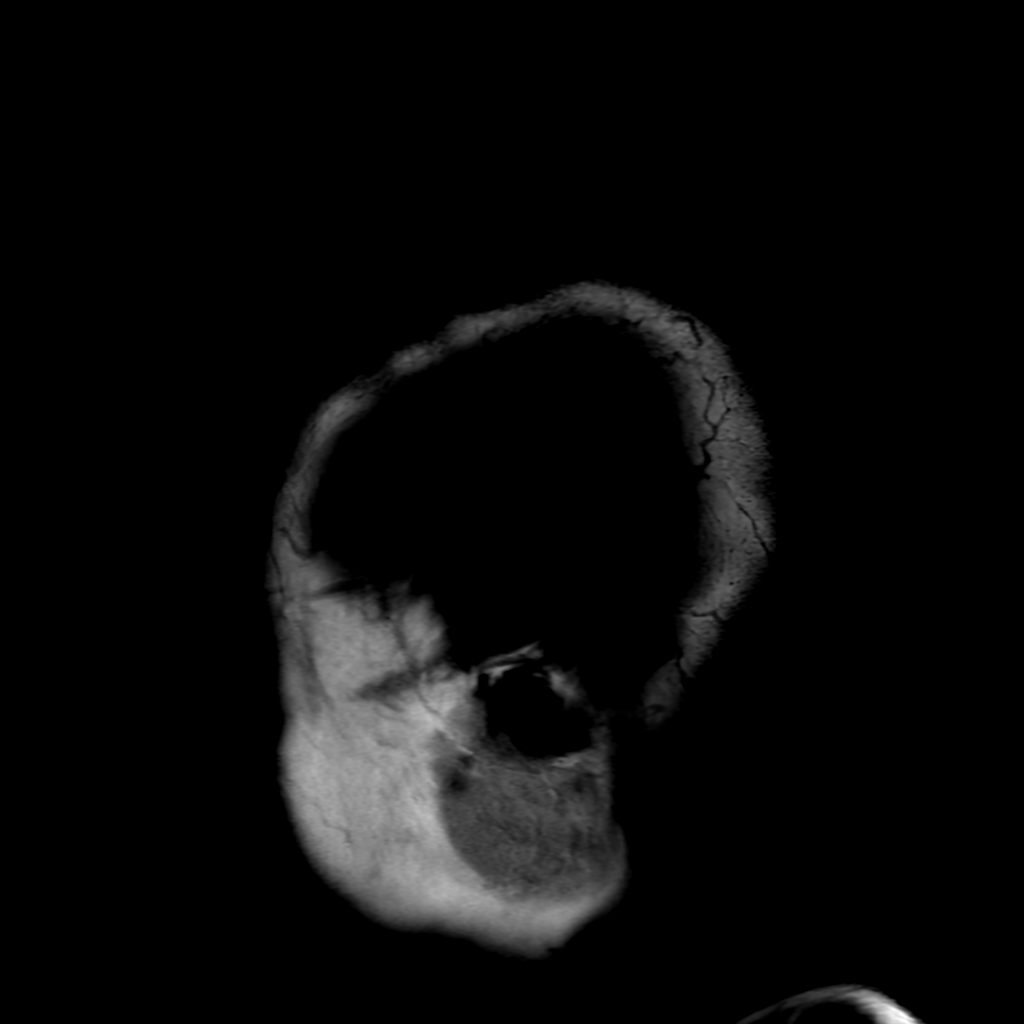

[Series 6: FLAIR · axial · 4.0mm · 0.45mm/px · z∈[-89,+41]mm · 3 of 31 slices shown (2 of 2)]
[im 1/31]
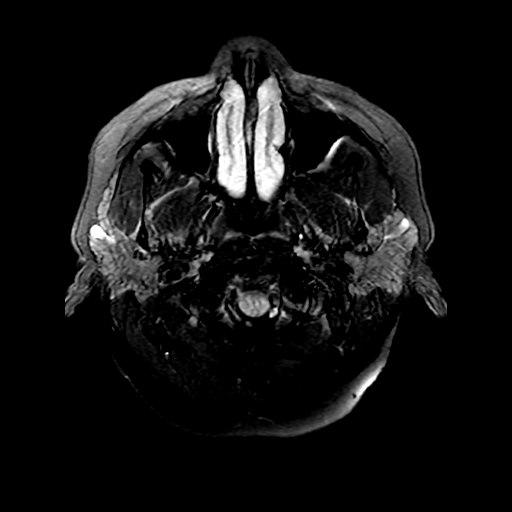
[im 16/31]
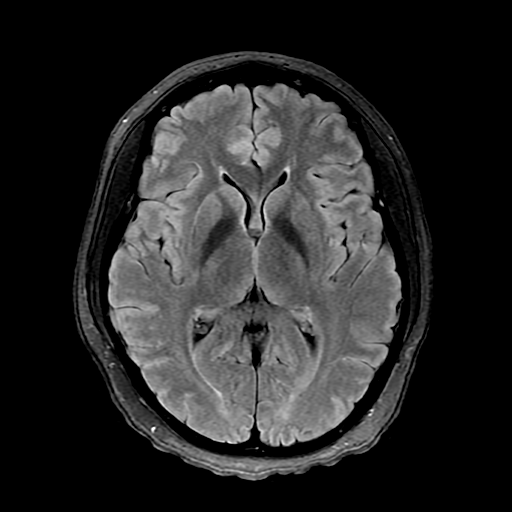
[im 31/31]
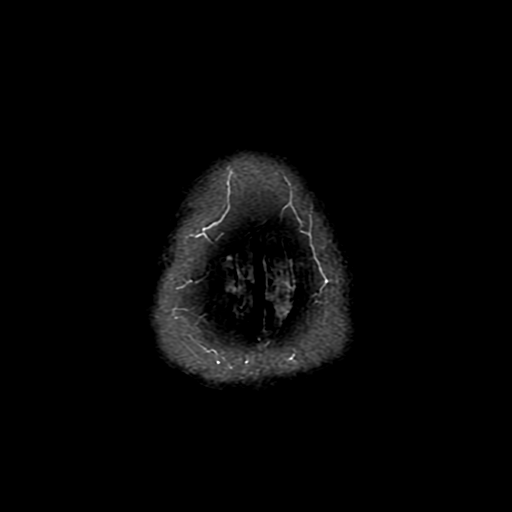

[Series 250: ADC · axial · 3.0mm · 0.94mm/px · z∈[-89,+41]mm · 4 of 44 slices shown (1 of 2)]
[im 1/44]
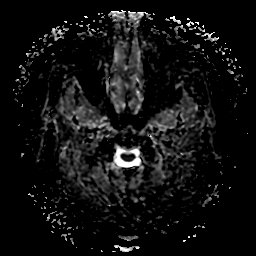
[im 15/44]
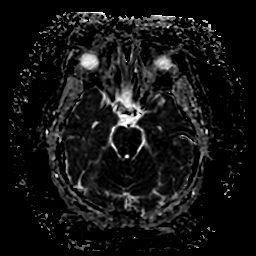
[im 29/44]
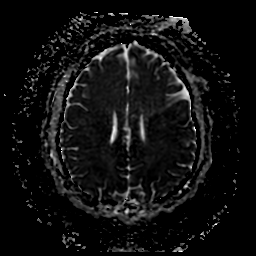
[im 44/44]
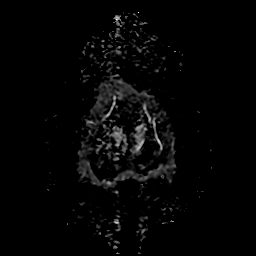

[Series 350: ADC · coronal · 4.0mm · 0.94mm/px · 3 of 34 slices shown (2 of 2)]
[im 1/34]
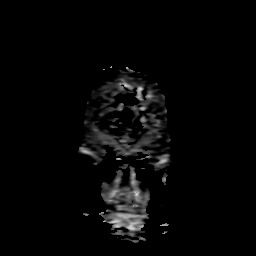
[im 17/34]
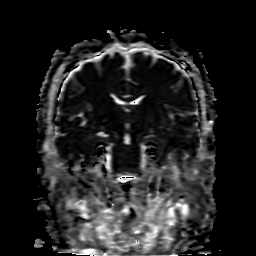
[im 34/34]
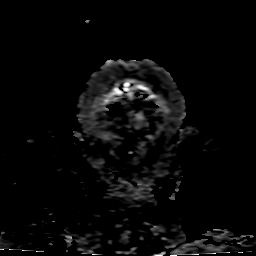

[28 of 48 positions shown; findings below may reference images not displayed]

FINDINGS: Brain:

Mild intermittent motion degradation.

Cerebral volume is normal.

No cortical encephalomalacia is identified. No significant cerebral
white matter disease.

There is no acute infarct.

No evidence of an intracranial mass.

No chronic intracranial blood products.

No extra-axial fluid collection.

No midline shift.

Vascular: Maintained flow voids within the proximal large arterial
vessels.

Skull and upper cervical spine: No focal suspicious marrow lesion.

Sinuses/Orbits: Visualized orbits show no acute finding. Trace
bilateral ethmoid sinus mucosal thickening.
IMPRESSION: Mildly motion degraded exam.

Unremarkable non-contrast MRI appearance of the brain. No evidence
of acute intracranial abnormality.

Minimal bilateral ethmoid sinus mucosal thickening.

## 2023-06-12 ENCOUNTER — Other Ambulatory Visit: Payer: Self-pay

## 2023-06-12 ENCOUNTER — Encounter: Payer: Self-pay | Admitting: Family Medicine

## 2023-06-12 DIAGNOSIS — E785 Hyperlipidemia, unspecified: Secondary | ICD-10-CM

## 2023-06-12 DIAGNOSIS — I10 Essential (primary) hypertension: Secondary | ICD-10-CM

## 2023-06-26 ENCOUNTER — Other Ambulatory Visit: Payer: Self-pay | Admitting: Family Medicine

## 2023-06-26 DIAGNOSIS — I10 Essential (primary) hypertension: Secondary | ICD-10-CM

## 2023-06-27 ENCOUNTER — Other Ambulatory Visit: Payer: 59

## 2023-07-04 ENCOUNTER — Ambulatory Visit: Payer: 59 | Admitting: Family Medicine

## 2023-07-11 ENCOUNTER — Other Ambulatory Visit: Payer: Self-pay

## 2023-07-11 ENCOUNTER — Other Ambulatory Visit: Payer: 59

## 2023-07-11 DIAGNOSIS — I10 Essential (primary) hypertension: Secondary | ICD-10-CM

## 2023-07-11 DIAGNOSIS — E785 Hyperlipidemia, unspecified: Secondary | ICD-10-CM

## 2023-07-12 LAB — CBC WITH DIFFERENTIAL/PLATELET
Basophils Absolute: 0 10*3/uL (ref 0.0–0.2)
Basos: 0 %
EOS (ABSOLUTE): 0.1 10*3/uL (ref 0.0–0.4)
Eos: 3 %
Hematocrit: 48.7 % — ABNORMAL HIGH (ref 34.0–46.6)
Hemoglobin: 16.4 g/dL — ABNORMAL HIGH (ref 11.1–15.9)
Immature Grans (Abs): 0 10*3/uL (ref 0.0–0.1)
Immature Granulocytes: 0 %
Lymphocytes Absolute: 2.4 10*3/uL (ref 0.7–3.1)
Lymphs: 50 %
MCH: 29.9 pg (ref 26.6–33.0)
MCHC: 33.7 g/dL (ref 31.5–35.7)
MCV: 89 fL (ref 79–97)
Monocytes Absolute: 0.5 10*3/uL (ref 0.1–0.9)
Monocytes: 10 %
Neutrophils Absolute: 1.8 10*3/uL (ref 1.4–7.0)
Neutrophils: 37 %
Platelets: 235 10*3/uL (ref 150–450)
RBC: 5.48 x10E6/uL — ABNORMAL HIGH (ref 3.77–5.28)
RDW: 12.8 % (ref 11.7–15.4)
WBC: 4.8 10*3/uL (ref 3.4–10.8)

## 2023-07-12 LAB — LIPID PANEL
Chol/HDL Ratio: 2.5 ratio (ref 0.0–4.4)
Cholesterol, Total: 131 mg/dL (ref 100–199)
HDL: 53 mg/dL (ref >39)
LDL Chol Calc (NIH): 68 mg/dL (ref 0–99)
Triglycerides: 43 mg/dL (ref 0–149)
VLDL Cholesterol Cal: 10 mg/dL (ref 5–40)

## 2023-07-12 LAB — COMPREHENSIVE METABOLIC PANEL
ALT: 17 IU/L (ref 0–32)
AST: 16 IU/L (ref 0–40)
Albumin: 4.3 g/dL (ref 3.9–4.9)
Alkaline Phosphatase: 37 IU/L — ABNORMAL LOW (ref 44–121)
BUN/Creatinine Ratio: 15 (ref 9–23)
BUN: 17 mg/dL (ref 6–24)
Bilirubin Total: 0.9 mg/dL (ref 0.0–1.2)
CO2: 22 mmol/L (ref 20–29)
Calcium: 9.6 mg/dL (ref 8.7–10.2)
Chloride: 101 mmol/L (ref 96–106)
Creatinine, Ser: 1.17 mg/dL — ABNORMAL HIGH (ref 0.57–1.00)
Globulin, Total: 2.5 g/dL (ref 1.5–4.5)
Glucose: 69 mg/dL — ABNORMAL LOW (ref 70–99)
Potassium: 3.9 mmol/L (ref 3.5–5.2)
Sodium: 138 mmol/L (ref 134–144)
Total Protein: 6.8 g/dL (ref 6.0–8.5)
eGFR: 59 mL/min/{1.73_m2} — ABNORMAL LOW (ref >59)

## 2023-07-25 ENCOUNTER — Ambulatory Visit: Payer: 59 | Admitting: Family Medicine

## 2023-07-25 ENCOUNTER — Other Ambulatory Visit: Payer: Self-pay | Admitting: Family Medicine

## 2023-07-25 ENCOUNTER — Encounter: Payer: Self-pay | Admitting: Family Medicine

## 2023-07-25 VITALS — BP 121/88 | HR 78 | Ht 62.99 in | Wt 236.1 lb

## 2023-07-25 DIAGNOSIS — I10 Essential (primary) hypertension: Secondary | ICD-10-CM

## 2023-07-25 DIAGNOSIS — R7989 Other specified abnormal findings of blood chemistry: Secondary | ICD-10-CM

## 2023-07-25 DIAGNOSIS — D751 Secondary polycythemia: Secondary | ICD-10-CM | POA: Diagnosis not present

## 2023-07-25 DIAGNOSIS — Z6841 Body Mass Index (BMI) 40.0 and over, adult: Secondary | ICD-10-CM

## 2023-07-25 NOTE — Assessment & Plan Note (Signed)
 Is seeing a nutritionist.  Insurance does not cover the weight loss medications.  Is on a high protein low-carb diet.  Encourage continued diet and exercise.

## 2023-07-25 NOTE — Assessment & Plan Note (Signed)
 Blood pressure at goal today.  Continue hydrochlorothiazide-valsartan.  Getting Cystatin C due to recent slightly elevated creatinine levels.

## 2023-07-25 NOTE — Patient Instructions (Signed)
 It was nice to see you today,  We addressed the following topics today: -I am going to check another lab in regards to your creatinine level and your hemoglobin levels.  I will let you know what the results mean when I get them - Continue with your weight loss goals.  100 g of protein daily is a good goal but if you feel full and you have only 70 g of protein I would not force yourself to eat an extra 30 g just to reach an arbitrary goal of 100. - Schedule your sleep study test.  Sleep apnea could be what is causing your hemoglobin levels to be elevated.  Have a great day,  Frederic Jericho, MD

## 2023-07-25 NOTE — Assessment & Plan Note (Signed)
 Mildly elevated hemoglobin has been present for the past years.  Likely due to sleep apnea.  Patient is a non-smoker.  Will get EPO level.

## 2023-07-25 NOTE — Progress Notes (Signed)
   Established Patient Office Visit  Subjective   Patient ID: Helen Clark, female    DOB: 05/01/1976  Age: 48 y.o. MRN: [id number]  Chief Complaint  Patient presents with   Medical Management of Chronic Issues    HPI  Patient here for follow-up of her hypertension and weight loss.  Patient is currently seeing a nutritionist at [facility].  She is trying a high protein, low carb diet.  Trying to drink plenty of fluids.  Trying to get 100 g of protein in the day and 80 to 100 ounces of water.  Is exercising by walking on the treadmill and started working out at [employer].  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  Hypertension-patient taking her valsartan-hydrochlorothiazide.  No questions about this.  Tolerating it well.  We talked about the patient's polycythemia and her elevated creatinine and getting additional testing to rule out causes of this.  Encouraged her to schedule the sleep study at home.   The 10-year ASCVD risk score (Arnett DK, et al., 2019) is: 1.3%  Health Maintenance Due  Topic Date Due   COVID-19 Vaccine (3 - 2024-25 season) 01/05/2023   Colonoscopy  Never done      Objective:     BP 121/88   Pulse 78   Ht 5' 2.99" (1.6 m)   Wt 236 lb 1.9 oz (107.1 kg)   SpO2 98%   BMI 41.84 kg/m    Physical Exam General: Alert, oriented Pulmonary: No respiratory distress Psych: Pleasant affect   No results found for any visits on 07/25/23.      Assessment & Plan:   Elevated serum creatinine -     Cystatin C  Polycythemia Assessment & Plan: Mildly elevated hemoglobin has been present for the past years.  Likely due to sleep apnea.  Patient is a non-smoker.  Will get EPO level.  Orders: -     Erythropoietin  Primary hypertension Assessment & Plan: Blood pressure at goal today.  Continue hydrochlorothiazide-valsartan.  Getting Cystatin C due to recent slightly elevated creatinine levels.   Morbid obesity with BMI of 40.0-44.9, adult  [facility]) Assessment & Plan: Is seeing a nutritionist.  Insurance does not cover the weight loss medications.  Is on a high protein low-carb diet.  Encourage continued diet and exercise.      Return in about 6 months (around 01/25/2024) for HTN, weight.    Sandre Kitty, MD

## 2023-07-26 LAB — ERYTHROPOIETIN: Erythropoietin: 7.5 m[IU]/mL (ref 2.6–18.5)

## 2023-07-26 LAB — CYSTATIN C: CYSTATIN C: 0.87 mg/L (ref 0.60–1.00)

## 2023-07-29 ENCOUNTER — Telehealth: Payer: Self-pay

## 2023-07-29 ENCOUNTER — Encounter: Payer: Self-pay | Admitting: Family Medicine

## 2023-07-29 NOTE — Telephone Encounter (Signed)
 Called pt to schedule a lab appt. VM was full. Sent Mychart message to callback and schedule

## 2023-07-29 NOTE — Telephone Encounter (Signed)
-----   Message from Sandre Kitty sent at 07/29/2023  7:27 AM EDT ----- Cystatin C lower than creatinine. EPO level normal in the context of chronically elevated hemoglobin.  Will need to get Jak mutation testing  Please call the patient and let her know her MyChart message results and schedule a follow-up lab visit for additional blood test.

## 2023-08-04 ENCOUNTER — Other Ambulatory Visit: Payer: Self-pay | Admitting: *Deleted

## 2023-08-04 DIAGNOSIS — D582 Other hemoglobinopathies: Secondary | ICD-10-CM

## 2023-08-15 ENCOUNTER — Other Ambulatory Visit

## 2023-08-20 LAB — JAK2 GENOTYPR

## 2023-08-23 ENCOUNTER — Other Ambulatory Visit: Payer: Self-pay | Admitting: Nurse Practitioner

## 2023-08-23 DIAGNOSIS — Z3042 Encounter for surveillance of injectable contraceptive: Secondary | ICD-10-CM

## 2023-08-24 ENCOUNTER — Encounter: Payer: Self-pay | Admitting: Family Medicine

## 2023-09-16 ENCOUNTER — Encounter: Payer: Self-pay | Admitting: Family Medicine

## 2023-09-16 ENCOUNTER — Other Ambulatory Visit: Payer: Self-pay | Admitting: Family Medicine

## 2023-09-16 MED ORDER — TRULANCE 3 MG PO TABS: 1.0000 | ORAL_TABLET | Freq: Every day | ORAL | 2 refills | Status: DC

## 2023-10-13 ENCOUNTER — Other Ambulatory Visit: Payer: Self-pay | Admitting: Family Medicine

## 2023-12-19 ENCOUNTER — Other Ambulatory Visit

## 2023-12-26 ENCOUNTER — Other Ambulatory Visit: Payer: Self-pay

## 2023-12-26 DIAGNOSIS — E785 Hyperlipidemia, unspecified: Secondary | ICD-10-CM

## 2023-12-26 DIAGNOSIS — I10 Essential (primary) hypertension: Secondary | ICD-10-CM

## 2023-12-26 DIAGNOSIS — E559 Vitamin D deficiency, unspecified: Secondary | ICD-10-CM

## 2024-01-14 ENCOUNTER — Other Ambulatory Visit: Payer: Self-pay | Admitting: Family Medicine

## 2024-01-14 DIAGNOSIS — I10 Essential (primary) hypertension: Secondary | ICD-10-CM

## 2024-01-23 ENCOUNTER — Other Ambulatory Visit

## 2024-01-23 DIAGNOSIS — Z6841 Body Mass Index (BMI) 40.0 and over, adult: Secondary | ICD-10-CM

## 2024-01-23 DIAGNOSIS — E785 Hyperlipidemia, unspecified: Secondary | ICD-10-CM

## 2024-01-23 DIAGNOSIS — I10 Essential (primary) hypertension: Secondary | ICD-10-CM

## 2024-01-23 DIAGNOSIS — E559 Vitamin D deficiency, unspecified: Secondary | ICD-10-CM

## 2024-01-25 ENCOUNTER — Ambulatory Visit: Payer: Self-pay

## 2024-01-29 ENCOUNTER — Ambulatory Visit

## 2024-01-29 VITALS — BP 107/65 | HR 84 | Temp 97.9°F | Ht 63.0 in | Wt 251.0 lb

## 2024-01-29 DIAGNOSIS — K581 Irritable bowel syndrome with constipation: Secondary | ICD-10-CM

## 2024-01-29 DIAGNOSIS — D751 Secondary polycythemia: Secondary | ICD-10-CM

## 2024-01-29 DIAGNOSIS — Z1231 Encounter for screening mammogram for malignant neoplasm of breast: Secondary | ICD-10-CM

## 2024-01-29 DIAGNOSIS — I10 Essential (primary) hypertension: Secondary | ICD-10-CM

## 2024-01-29 DIAGNOSIS — F4320 Adjustment disorder, unspecified: Secondary | ICD-10-CM | POA: Insufficient documentation

## 2024-01-29 DIAGNOSIS — Z1212 Encounter for screening for malignant neoplasm of rectum: Secondary | ICD-10-CM

## 2024-01-29 DIAGNOSIS — Z1211 Encounter for screening for malignant neoplasm of colon: Secondary | ICD-10-CM

## 2024-01-29 DIAGNOSIS — Z6841 Body Mass Index (BMI) 40.0 and over, adult: Secondary | ICD-10-CM

## 2024-01-29 DIAGNOSIS — R200 Anesthesia of skin: Secondary | ICD-10-CM | POA: Insufficient documentation

## 2024-01-29 DIAGNOSIS — R202 Paresthesia of skin: Secondary | ICD-10-CM

## 2024-01-29 MED ORDER — LUBIPROSTONE 8 MCG PO CAPS: 8.0000 ug | ORAL_CAPSULE | Freq: Two times a day (BID) | ORAL | 2 refills | Status: AC

## 2024-01-29 NOTE — Assessment & Plan Note (Signed)
 Chronic numbness in right arm and hand, possibly carpal tunnel syndrome. Previous neck x-ray showed loss of cervical curve. - Recommend use of wrist splint consistently for 2-3 weeks. - Consider MRI of neck if symptoms do not improve. - Obtain previous neck x-ray results for further evaluation.

## 2024-01-29 NOTE — Assessment & Plan Note (Signed)
 Chronic constipation with insurance issues for Trulance . Discussed Amitiza  as alternative. - Send prescription for Amitiza  to [employer] for prior authorization. - Coordinate with [employer] for prior authorization paperwork.

## 2024-01-29 NOTE — Assessment & Plan Note (Signed)
 Obesity with focus on weight loss. Discussed dietary changes and exercise. Insurance does not cover weight loss medications. - Encourage dietary modifications including increased protein intake and reduced intake of fried and fatty foods. - Recommend walking as a form of exercise. - Discuss use of My Fitness Pal or Lose It app for tracking protein and calorie intake.

## 2024-01-29 NOTE — Assessment & Plan Note (Signed)
 Elevated hematocrit and RBC count, possibly due to obesity or sleep apnea. Sleep study scheduled. - Proceed with scheduled sleep study on October 17th. - Review sleep study results upon completion.

## 2024-01-29 NOTE — Assessment & Plan Note (Signed)
 Experiencing grief reaction after mother's passing. Interested in grief counseling. - Provide resources for grief counseling at [facility] the [place]. - Document attendance in grief counseling for FMLA paperwork.

## 2024-01-29 NOTE — Assessment & Plan Note (Signed)
 BP Goal <140/90. BP well within goal today. Continue valsartan -hydrochlorothiazide  daily.

## 2024-01-29 NOTE — Patient Instructions (Addendum)
 VISIT SUMMARY: Today, you had a follow-up appointment to discuss your ongoing constipation, weight management, and other health concerns. We reviewed your current medications, recent lab results, and discussed several new recommendations to help manage your conditions.  YOUR PLAN: -GRIEF REACTION: You are experiencing grief after the recent passing of your mother. We have provided you with resources for grief counseling at [facility] the [place]. Please document your attendance in grief counseling for your FMLA paperwork.  -CONSTIPATION: You have chronic constipation and have had issues with insurance coverage for Trulance . We discussed Amitiza  as an alternative, and a prescription has been sent to [facility] for prior authorization. We will coordinate with the pharmacy to complete the necessary paperwork.  -OBESITY: You are focusing on weight loss. We discussed making dietary changes, such as increasing your protein intake and reducing fried and fatty foods. We also recommend walking as a form of exercise and using apps like My Fitness Pal or Lose It to track your protein and calorie intake.  -PREDIABETES: Your recent lab results show that you have prediabetes, which means your blood sugar levels are higher than normal but not high enough to be classified as diabetes. We encourage you to make lifestyle modifications to address this.  -HYPERTENSION: Your blood pressure is well-managed with valsartan , and it remains stable. Continue taking valsartan  as prescribed.  -CHRONIC KIDNEY DISEASE, UNSPECIFIED STAGE: You have chronic kidney disease, but your kidney function is stable. Valsartan  is helping to protect your kidneys. We will re-evaluate your kidney function in three months and consider a renal ultrasound if there are any changes.  -ELEVATED HEMATOCRIT AND RED BLOOD CELL COUNT: Your hematocrit and red blood cell count are elevated, which could be related to obesity or sleep apnea. You  have a sleep study scheduled for October 17th, and we will review the results once it is completed.  -RIGHT ARM AND HAND NUMBNESS, POSSIBLE CARPAL TUNNEL SYNDROME: You have chronic numbness in your right arm and hand, which may be due to carpal tunnel syndrome. We recommend using your wrist splint consistently for 2-3 weeks. If your symptoms do not improve, we may consider an MRI of your neck. We will also obtain your previous neck x-ray results for further evaluation.  -GENERAL HEALTH MAINTENANCE: We discussed routine health maintenance. You declined the flu shot. We have ordered a Cologuard test for colon cancer screening and a mammogram through the mobile unit.  INSTRUCTIONS: Please follow up in three months to re-evaluate your kidney function. Attend your scheduled sleep study on October 17th. Document your attendance in grief counseling for FMLA paperwork. Use your wrist splint consistently for 2-3 weeks and let us  know if your symptoms do not improve.  If you have any problems before your next visit feel free to message me via MyChart (minor issues or questions) or call the office, otherwise you may reach out to schedule an office visit.  Thank you! Saddie Sacks, PA-C  uuuuuu

## 2024-01-29 NOTE — Progress Notes (Signed)
 Established Patient Office Visit  Subjective   Patient ID: Helen Clark, female    DOB: 20-Nov-1975  Age: 48 y.o. MRN: [id number]  Chief Complaint  Patient presents with   Medical Management of Chronic Issues    HPI History of Present Illness   Helen Clark is a 48 year old female who presents for follow-up regarding constipation and weight management.  Constipation - Ongoing constipation symptoms - Previously trialed Linzess , but insurance did not cover it - Currently using Trulance  through samples, which also requires prior authorization - Trulance  is used intermittently during flare-ups rather than daily  Weight management and prediabetes - Concern about elevated hemoglobin A1c in the prediabetic range - Previously prescribed metformin  for weight loss without significant benefit; discontinued use  Right upper extremity paresthesia - Numbness in right arm, attributed to carpal tunnel syndrome - Symptoms worsen with prolonged pressure on the hand - Owns a wrist splint but has not been using it recently - Cervical spine x-ray earlier this year showed loss of normal curvature and significant pain  Psychological stress and grief - Recently experienced the unexpected passing of her mother, who was on dialysis for 2 years - Mother lived two and a half hours away; was found after missing a dialysis appointment - Feels overwhelmed at times but does not identify as depressed - Interested in resources for grief counseling  Sleep disturbance - Scheduled for a sleep study on October 17th          ROS Per HPI.    Objective:     BP 107/65   Pulse 84   Temp 97.9 F (36.6 C) (Oral)   Ht 5' 3 (1.6 m)   Wt 251 lb 0.6 oz (113.9 kg)   SpO2 97%   BMI 44.47 kg/m    Physical Exam Constitutional:      General: She is not in acute distress.    Appearance: Normal appearance.  Cardiovascular:     Rate and Rhythm: Normal rate and regular rhythm.     Heart sounds: Normal  heart sounds. No murmur heard.    No friction rub. No gallop.  Pulmonary:     Effort: Pulmonary effort is normal. No respiratory distress.     Breath sounds: Normal breath sounds.  Musculoskeletal:        General: No swelling.  Skin:    General: Skin is warm and dry.  Neurological:     General: No focal deficit present.     Mental Status: She is alert.  Psychiatric:        Mood and Affect: Mood normal.        Behavior: Behavior normal.        Thought Content: Thought content normal.    No results found for any visits on 01/29/24.  Last CBC Lab Results  Component Value Date   WBC 5.3 01/23/2024   HGB 15.4 01/23/2024   HCT 48.6 (H) 01/23/2024   MCV 91 01/23/2024   MCH 28.8 01/23/2024   RDW 13.1 01/23/2024   PLT 274 01/23/2024   Last metabolic panel Lab Results  Component Value Date   GLUCOSE 99 01/23/2024   NA 139 01/23/2024   K 4.4 01/23/2024   CL 103 01/23/2024   CO2 22 01/23/2024   BUN 20 01/23/2024   CREATININE 1.14 (H) 01/23/2024   EGFR 60 01/23/2024   CALCIUM 9.3 01/23/2024   PROT 6.8 01/23/2024   ALBUMIN 4.1 01/23/2024   LABGLOB 2.7 01/23/2024  AGRATIO 1.6 08/23/2022   BILITOT 0.5 01/23/2024   ALKPHOS 36 (L) 01/23/2024   AST 19 01/23/2024   ALT 31 01/23/2024   Last lipids Lab Results  Component Value Date   CHOL 123 01/23/2024   HDL 51 01/23/2024   LDLCALC 62 01/23/2024   TRIG 38 01/23/2024   CHOLHDL 2.4 01/23/2024   Last hemoglobin A1c Lab Results  Component Value Date   HGBA1C 5.7 (H) 01/23/2024   Last thyroid functions Lab Results  Component Value Date   TSH 2.310 01/23/2024   Last vitamin D  Lab Results  Component Value Date   VD25OH 40.1 01/23/2024      The ASCVD Risk score (Arnett DK, et al., 2019) failed to calculate for the following reasons:   The valid total cholesterol range is 130 to 320 mg/dL    Assessment & Plan:   Encounter for colorectal cancer screening using Cologuard test -     Cologuard  Irritable bowel  syndrome with constipation Assessment & Plan: Chronic constipation with insurance issues for Trulance . Discussed Amitiza  as alternative. - Send prescription for Amitiza  to [employer] for prior authorization. - Coordinate with [employer] for prior authorization paperwork.   Screening mammogram for breast cancer -     3D Screening Mammogram, Left and Right; Future  Grief reaction Assessment & Plan: Experiencing grief reaction after mother's passing. Interested in grief counseling. - Provide resources for grief counseling at [facility] the [place]. - Document attendance in grief counseling for FMLA paperwork.   Morbid obesity with BMI of 40.0-44.9, adult [facility]) Assessment & Plan: Obesity with focus on weight loss. Discussed dietary changes and exercise. Insurance does not cover weight loss medications. - Encourage dietary modifications including increased protein intake and reduced intake of fried and fatty foods. - Recommend walking as a form of exercise. - Discuss use of My Fitness Pal or Lose It app for tracking protein and calorie intake.   Polycythemia Assessment & Plan: Elevated hematocrit and RBC count, possibly due to obesity or sleep apnea. Sleep study scheduled. - Proceed with scheduled sleep study on October 17th. - Review sleep study results upon completion.   Primary hypertension Assessment & Plan: BP Goal <140/90. BP well within goal today. Continue valsartan -hydrochlorothiazide  daily.   Numbness and tingling of right arm Assessment & Plan: Chronic numbness in right arm and hand, possibly carpal tunnel syndrome. Previous neck x-ray showed loss of cervical curve. - Recommend use of wrist splint consistently for 2-3 weeks. - Consider MRI of neck if symptoms do not improve. - Obtain previous neck x-ray results for further evaluation.   Other orders -     Lubiprostone ; Take 1 capsule (8 mcg total) by mouth 2 (two) times daily with a meal.   Dispense: 180 capsule; Refill: 2   Return in about 4 months (around 05/30/2024) for HTN, weight, IBS.    Saddie JULIANNA Sacks, PA-C

## 2024-01-30 ENCOUNTER — Ambulatory Visit

## 2024-02-02 ENCOUNTER — Telehealth: Payer: Self-pay

## 2024-02-02 NOTE — Telephone Encounter (Signed)
 Called pt to let her know that the [facility] paperwork was received and completed.  29$ is  required for all paperwork.  Pt was a little uneasy about the fee asking if it was new.

## 2024-02-02 NOTE — Telephone Encounter (Signed)
 Pt then calls back asking if it was a fee to fax. I advised her no there was no fee however the 29$ fee would need to be paid before we would fax or at pick up.   Pt reports that she isnt sure that she will come get the forms so would let us  know.   I filed the documents.

## 2024-02-05 LAB — COMPREHENSIVE METABOLIC PANEL WITH GFR
ALT: 31 IU/L (ref 0–32)
AST: 19 IU/L (ref 0–40)
Albumin: 4.1 g/dL (ref 3.9–4.9)
Alkaline Phosphatase: 36 IU/L — ABNORMAL LOW (ref 41–116)
BUN/Creatinine Ratio: 18 (ref 9–23)
BUN: 20 mg/dL (ref 6–24)
Bilirubin Total: 0.5 mg/dL (ref 0.0–1.2)
CO2: 22 mmol/L (ref 20–29)
Calcium: 9.3 mg/dL (ref 8.7–10.2)
Chloride: 103 mmol/L (ref 96–106)
Creatinine, Ser: 1.14 mg/dL — ABNORMAL HIGH (ref 0.57–1.00)
Globulin, Total: 2.7 g/dL (ref 1.5–4.5)
Glucose: 99 mg/dL (ref 70–99)
Potassium: 4.4 mmol/L (ref 3.5–5.2)
Sodium: 139 mmol/L (ref 134–144)
Total Protein: 6.8 g/dL (ref 6.0–8.5)
eGFR: 60 mL/min/1.73 (ref >59)

## 2024-02-05 LAB — CBC WITH DIFFERENTIAL/PLATELET
Basophils Absolute: 0.1 x10E3/uL (ref 0.0–0.2)
Basos: 1 %
EOS (ABSOLUTE): 0.3 x10E3/uL (ref 0.0–0.4)
Eos: 6 %
Hematocrit: 48.6 % — ABNORMAL HIGH (ref 34.0–46.6)
Hemoglobin: 15.4 g/dL (ref 11.1–15.9)
Immature Grans (Abs): 0 x10E3/uL (ref 0.0–0.1)
Immature Granulocytes: 0 %
Lymphocytes Absolute: 2.6 x10E3/uL (ref 0.7–3.1)
Lymphs: 49 %
MCH: 28.8 pg (ref 26.6–33.0)
MCHC: 31.7 g/dL (ref 31.5–35.7)
MCV: 91 fL (ref 79–97)
Monocytes Absolute: 0.5 x10E3/uL (ref 0.1–0.9)
Monocytes: 9 %
Neutrophils Absolute: 1.9 x10E3/uL (ref 1.4–7.0)
Neutrophils: 35 %
Platelets: 274 x10E3/uL (ref 150–450)
RBC: 5.35 x10E6/uL — ABNORMAL HIGH (ref 3.77–5.28)
RDW: 13.1 % (ref 11.7–15.4)
WBC: 5.3 x10E3/uL (ref 3.4–10.8)

## 2024-02-05 LAB — LIPID PANEL
Chol/HDL Ratio: 2.4 ratio (ref 0.0–4.4)
Cholesterol, Total: 123 mg/dL (ref 100–199)
HDL: 51 mg/dL (ref >39)
LDL Chol Calc (NIH): 62 mg/dL (ref 0–99)
Triglycerides: 38 mg/dL (ref 0–149)
VLDL Cholesterol Cal: 10 mg/dL (ref 5–40)

## 2024-02-05 LAB — VITAMIN D 25 HYDROXY (VIT D DEFICIENCY, FRACTURES): Vit D, 25-Hydroxy: 40.1 ng/mL (ref 30.0–100.0)

## 2024-02-05 LAB — HEMOGLOBIN A1C
Est. average glucose Bld gHb Est-mCnc: 117 mg/dL
Hgb A1c MFr Bld: 5.7 % — ABNORMAL HIGH (ref 4.8–5.6)

## 2024-02-05 LAB — TSH: TSH: 2.31 u[IU]/mL (ref 0.450–4.500)

## 2024-02-05 LAB — INSULIN, FREE AND TOTAL
Free Insulin: 32 uU/mL — ABNORMAL HIGH
Total Insulin: 32 uU/mL

## 2024-02-12 ENCOUNTER — Encounter

## 2024-02-16 ENCOUNTER — Ambulatory Visit (HOSPITAL_BASED_OUTPATIENT_CLINIC_OR_DEPARTMENT_OTHER): Attending: Internal Medicine | Admitting: Internal Medicine

## 2024-02-16 VITALS — Ht 62.0 in | Wt 229.0 lb

## 2024-02-16 DIAGNOSIS — G4733 Obstructive sleep apnea (adult) (pediatric): Secondary | ICD-10-CM | POA: Diagnosis present

## 2024-02-21 NOTE — Procedures (Signed)
 [facility] [address] [place], [place] [address] Tel: [phone]   Fax: [phone]  Home Sleep Test Interpretation  Patient Name: Helen Clark, Helen Clark Date: 02/16/2024  Date of Birth: 05/03/1976 Study Type: HST  Age: 48 year MRN #: [id number]  Sex: Female Interpreting Physician: NEYSA RAMA, [id number]  Height: 5' 2 Referring Physician: Joesph DELENA Sear  Weight: 229.0 lbs Recording Tech: Will Poet RRT RPSGT RST  BMI: 42.1 Scoring Tech: Will Poet RRT RPSGT RST  ESS: 1 Neck Size: 14  %%startinterp%% %%startinterp%% Indications for Polysomnography The patient is a 48 year-old Female who is 5' 2 and weighs 229.0 lbs. Her BMI equals 42.1.  A home sleep apnea test was performed to evaluate for -OSA.  Medication  No Data.   Polysomnogram Data A home sleep test recorded the standard physiologic parameters including EKG, nasal and oral airflow.  Respiratory parameters of chest and abdominal movements were recorded with Respiratory Inductance Plethysmography belts.  Oxygen saturation was recorded by pulse oximetry.   Study Architecture The total recording time of the polysomnogram was 438.0 minutes.  The total monitoring time was 438.5 minutes.  Time spent in Supine position was 306.0 minutes.   Respiratory Events The study revealed a presence of 44 obstructive, 1 central, and - mixed apneas resulting in an Apnea index of 6.2 events per hour.  There were 96 hypopneas (>=3% desaturation and/or arousal) resulting in an Apnea\Hypopnea Index (AHI >=3% desaturation and/or arousal) of 19.3 events per hour.  There were 56 hypopneas (>=4% desaturation) resulting in an Apnea\Hypopnea Index (AHI >=4% desaturation) of 13.8 events per hour.  There were - Respiratory Effort Related Arousals resulting in a RERA index of - events per hour. The Respiratory Disturbance Index is 19.3 events per hour.  The snore index was - events per hour.  Mean oxygen  saturation was 97.8%.  The lowest oxygen saturation during monitoring time was 87.0%.  Time spent <=88% oxygen saturation was 0.3 minutes (0.1%).  Cardiac Summary The average pulse rate was 89.8 bpm.  The minimum pulse rate was 71.0 bpm while the maximum pulse rate was 115.0 bpm.    Comments: Moderate obstructive sleep apnea, AHI(3%) 19.3/hr. Snoring with oxygen desaturation to a nadir of 87%, mean 97.8%.  Diagnosis: Obstructive sleep apnea  Recommendations: Suggest autopap, CPAP titration sleep study or fitted oral appliance.   This study was personally reviewed and electronically signed by: Dr. RAMA JONETTA NEYSA Accredited Board Certified in Sleep Medicine Date/Time: 02/21/24    1:13    %%endinterp%% %%endinterp%%    Study Overview  Recording Time: 515.9 min. Monitoring Time: 438.5 min.  Analysis Start:  09:43:07 PM Supine Time: 306.0 min.  Analysis Stop:  05:01:06 AM     Study Summary   Count Index Longest Event Duration  Apneas & Hypopneas: 141 19.3  Apneas: 26.2 sec.     Hypopneas: 100.4 sec.  RERAs: - - - sec.  Desaturations: 158 21.6 150.0 sec.  Snores: - - - sec.    Minimum Oxygen Saturation: 87.0%    Respiratory Summary   Total Duration Supine Non-Supine   Count Index Average Longest Count Index Count Index  Obstructive Apnea 44 6.0 13.8 26.2 36 7.1 8 3.6   Mixed Apnea - - - - - - - -   Central Apnea 1 0.1 12.9 12.9 1 0.2 - -   Total Apneas 45 6.2 13.7 26.2 37 7.3 8 3.6  Hypopneas 3% 96 13.1 N.A. N.A. 56 11.0 40 18.1   Apneas & Hyp. 3% 141 19.3 N.A. N.A. 93 18.2 48 21.7            Hypopneas 4% 56 7.7 N.A. N.A. 31 6.1 25 11.3  Apneas & Hyp. 4% 101 13.8 N.A. N.A. 68 13.3 33 14.9             RERAs - - - - - - - -  RDI 141 19.3 N.A. N.A. 93 18.2 48 21.7   Oxygen Saturation Summary   Total Supine Non-Supine  Average SpO2 97.8% 98.0% 97.5%  Minimum SpO2 87.0% 87.0% 88.0%   Maximum SpO2 100.0% 100.0% 100.0%   Oxygen Saturation  Distribution  Range (%) Time in range (min) Time in range (%)  90.0 - 100.0 435.8 99.8%  80.0 - 90.0 0.7 0.2%  70.0 - 80.0 - -  60.0 - 70.0 - -  50.0 - 60.0 - -  0.0 - 50.0 - -  Time Spent <=88% SpO2  Range (%) Time in range (min) Time in range (%)  0.0 - 88.0 0.3 0.1%  Cardiac Summary   Total Supine Non-Supine  Average Pulse Rate (BPM) 89.8 87.3 95.6  Minimum Pulse Rate (BPM) 71.0 71.0 80.0  Maximum Pulse Rate (BPM) 115.0 115.0 115.0                      Technologist Comments  -                         Reggy Neysa Bateman, Biomedical engineer of Sleep Medicine  ELECTRONICALLY SIGNED ON:  02/21/2024, 1:08 PM [facility] PH: [phone]   FX: [phone] ACCREDITED BY THE [employer] OF SLEEP MEDICINE

## 2024-02-25 MED ORDER — ZEPBOUND 2.5 MG/0.5ML ~~LOC~~ SOAJ: 2.5000 mg | SUBCUTANEOUS | 0 refills | Status: AC

## 2024-03-05 ENCOUNTER — Ambulatory Visit
Admission: RE | Admit: 2024-03-05 | Discharge: 2024-03-05 | Disposition: A | Discharge status: Home / Self Care | Source: Ambulatory Visit

## 2024-03-05 DIAGNOSIS — Z1231 Encounter for screening mammogram for malignant neoplasm of breast: Secondary | ICD-10-CM

## 2024-03-18 ENCOUNTER — Ambulatory Visit: Admitting: Allergy

## 2024-03-18 ENCOUNTER — Other Ambulatory Visit: Payer: Self-pay

## 2024-03-18 ENCOUNTER — Encounter: Payer: Self-pay | Admitting: Allergy

## 2024-03-18 VITALS — BP 118/84 | HR 86 | Temp 98.4°F | Resp 16 | Ht 64.0 in | Wt 252.8 lb

## 2024-03-18 DIAGNOSIS — L508 Other urticaria: Secondary | ICD-10-CM

## 2024-03-18 DIAGNOSIS — T783XXA Angioneurotic edema, initial encounter: Secondary | ICD-10-CM

## 2024-03-18 DIAGNOSIS — L509 Urticaria, unspecified: Secondary | ICD-10-CM

## 2024-03-18 MED ORDER — CETIRIZINE HCL 10 MG PO TABS: 10.0000 mg | ORAL_TABLET | Freq: Two times a day (BID) | ORAL | 2 refills | Status: DC

## 2024-03-18 MED ORDER — FAMOTIDINE 20 MG PO TABS: 20.0000 mg | ORAL_TABLET | Freq: Two times a day (BID) | ORAL | 2 refills | Status: DC

## 2024-03-18 NOTE — Progress Notes (Signed)
New Patient Note  RE: Helen Clark MRN: [id number] DOB: November 14, 1975 Date of Office Visit: 03/18/2024  Consult requested by: Gayle Saddie JULIANNA DEVONNA Primary care provider: Gayle Saddie JULIANNA, PA-C  Chief Complaint: Establish Care (She states she was referred by [facility] because she is having hives since 3 weeks to now. They did the prick test on her yesterday and nothing showed up. )  History of Present Illness: I had the pleasure of seeing Enis Diviney for initial evaluation at the [facility] on 03/18/2024. She is a 48 y.o. female, who is referred here by Gayle Saddie JULIANNA, PA-C for the evaluation of hives.  Discussed the use of AI scribe software for clinical note transcription with the patient, who gave verbal consent to proceed.    She has been experiencing recurrent hives and lip swelling since the first week of October. The hives primarily affect her thighs and arms, are very itchy, red, and raised, and each hive lasts about a day if untreated. She experiences them daily, and they come and go.  She initially noticed symptoms after consuming certain foods like plain Wise potato chips and Doritos, which caused her to feel 'fiery itchy inside' and subsequently develop hives. She also experienced lip swelling after eating [employer] rice and popcorn with extra butter oil. Despite avoiding these foods, she continues to experience daily hives and swelling.  She completed a week and a half course of prednisone last Monday, prescribed by a nurse practitioner. She has also been using over-the-counter antihistamines like Benadryl and [employer]'s generic antihistamine, but the symptoms persist.   She underwent a scratch test at [facility] this Monday which apparently was all negative to a food panel and subsequently was referred to our clinic. She has no history of asthma, eczema, or seasonal allergies, and there have been no recent changes in her diet, medications, or personal care  products. No fever, chills, or respiratory symptoms associated with the hives.  Her family history is notable for her daughter having seasonal allergies, but no other family members have asthma, allergies, or eczema. 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     Rash started about 1 month ago. Mainly occurs on her thighs, arms. Describes them as red, raised, itchy. Individual rashes lasts about 1-2 days. No ecchymosis upon resolution. Associated symptoms include: lip/facial swelling.  Frequency of episodes: daily. Suspected triggers are unknown - concerned about potato chips and dorito chips. Denies any fevers, chills, changes in medications, foods, personal care products or recent infections. She has tried the following therapies: benadryl with some benefit. Systemic steroids: yes. Currently on no daily antihistamines.  Previous history of rash/hives: no. Skin testing to foods yesterday was all negative at [facility].  Patient is up to date with the following cancer screening tests: none.   Reviewed images on the phone consistent with urticarial rash.   Assessment and Plan: Kaylamarie is a 48 y.o. female with:    Chronic idiopathic urticaria with angioedema Persistent urticaria and angioedema without identifiable triggers. Previous prednisone provided temporary relief. Food panel skin testing was all negative per patient done at outside facility. Based on clinical history, she likely has chronic idiopathic urticaria. Discussed with patient, that urticaria is usually caused by release of histamine by cutaneous mast cells but sometimes it is non-histamine mediated. Explained that urticaria is not always associated with allergies. In most cases, the exact etiology for urticaria can not be established  and it is considered idiopathic. Discussed the role of adding omalizumab  (anti-IgE antibodies) in controlling urticaria in refractory patients. Informational pamphlet  given. Start zyrtec (cetirizine) 10mg  OR allegra (fexofenadine) 180mg  twice a day. If symptoms are not controlled or causes drowsiness let us  know. Start Pepcid (famotidine) 20mg  twice a day.  Avoid the following potential triggers: alcohol, tight clothing, NSAIDs, hot showers and getting overheated. See below for proper skin care.  If you have another flare within the next few days, send me a mychart message and may need to send in some additional steroids.  Get bloodwork.  Return in about 4 weeks (around 04/15/2024).  Meds ordered this encounter  Medications   cetirizine (ZYRTEC ALLERGY) 10 MG tablet    Sig: Take 1 tablet (10 mg total) by mouth 2 (two) times daily.    Dispense:  60 tablet    Refill:  2   famotidine (PEPCID) 20 MG tablet    Sig: Take 1 tablet (20 mg total) by mouth 2 (two) times daily.    Dispense:  60 tablet    Refill:  2   Lab Orders         Chronic Urticaria (CU) Eval         Tryptase         Comprehensive metabolic panel with GFR         ANA, IFA (with reflex)         C3 and C4         Alpha-Gal Panel      Other allergy screening: Asthma: no Rhino conjunctivitis: no Food allergy: currently avoiding rice and popcorn due to recent vents Medication allergy: no Hymenoptera allergy: no Eczema:no History of recurrent infections suggestive of immunodeficency: no  Diagnostics: None.   Past Medical History: Patient Active Problem List   Diagnosis Date Noted   Grief reaction 01/29/2024   Numbness and tingling of right arm 01/29/2024   Polycythemia 07/25/2023   Insomnia 12/27/2022   Abnormal weight gain 08/23/2022   Vitamin D  deficiency 08/23/2022   Impaired fasting glucose 08/23/2022   Dyslipidemia, goal LDL below 100 08/23/2022   Acne vulgaris 01/27/2022   Other fatigue 07/01/2021   Memory problem 12/11/2020   Irritable bowel syndrome with constipation 12/11/2020   Post traumatic stress disorder 09/25/2020   Abnormal renal function 08/15/2020    Hypertension 05/27/2017   Morbid obesity with BMI of 40.0-44.9, adult (HCC) 05/27/2017   Past Medical History:  Diagnosis Date   Angio-edema    Family history of adverse reaction to anesthesia    mother has n/v   Headache    Hypertension    Phreesia 08/09/2020   Urticaria    Past Surgical History: Past Surgical History:  Procedure Laterality Date   NO PAST SURGERIES     RADIOLOGY WITH ANESTHESIA N/A 01/02/2021   Procedure: MRI WITH ANESTHESIA BRAIN WITHOUT CONTRAST;  Surgeon: Radiologist, Medication, MD;  Location: [facility] OR;  Service: Radiology;  Laterality: N/A;   Medication List:  Current Outpatient Medications  Medication Sig Dispense Refill   acetaminophen (TYLENOL) 500 MG tablet Take 1,000 mg by mouth every 6 (six) hours as needed (for pain.).     cetirizine (ZYRTEC ALLERGY) 10 MG tablet Take 1 tablet (10 mg total) by mouth 2 (two) times daily. 60 tablet 2   clindamycin  (CLEOCIN  T) 1 % external solution Apply topically daily as needed. 30 mL 2   famotidine (PEPCID) 20 MG tablet Take 1 tablet (20 mg total) by mouth  2 (two) times daily. 60 tablet 2   lubiprostone  (AMITIZA ) 8 MCG capsule Take 1 capsule (8 mcg total) by mouth 2 (two) times daily with a meal. 180 capsule 2   medroxyPROGESTERone  Acetate 150 MG/ML SUSY Inject 1 mL (150 mg total) into the muscle every 3 (three) months. 1 mL 3   Multiple Vitamin (MULTIVITAMIN WITH MINERALS) TABS tablet Take 1 tablet by mouth every evening.     valsartan -hydrochlorothiazide  (DIOVAN -HCT) 160-25 MG tablet TAKE 1 TABLET BY MOUTH EVERY DAY 90 tablet 3   metFORMIN  (GLUCOPHAGE ) 500 MG tablet Take 1 tablet (500 mg total) by mouth 2 (two) times daily with a meal. (Patient not taking: Reported on 03/18/2024) 180 tablet 3   tirzepatide (ZEPBOUND) 2.5 MG/0.5ML Pen Inject 2.5 mg into the skin once a week. (Patient not taking: Reported on 03/18/2024) 2 mL 0   No current facility-administered medications for this visit.   Allergies: No Known  Allergies Social History: Social History   Socioeconomic History   Marital status: Single    Spouse name: Not on file   Number of children: Not on file   Years of education: Not on file   Highest education level: Not on file  Occupational History   Occupation: RN  Tobacco Use   Smoking status: Never   Smokeless tobacco: Never  Substance and Sexual Activity   Alcohol use: Never   Drug use: Never   Sexual activity: Not Currently  Other Topics Concern   Not on file  Social History Narrative   Not on file   Social Drivers of Health   Financial Resource Strain: Low Risk  (03/12/2023)   Received from [facility]   Overall Financial Resource Strain (CARDIA)    Difficulty of Paying Living Expenses: Not hard at all  Food Insecurity: No Food Insecurity (03/12/2023)   Received from [facility]   Hunger Vital Sign    Within the past 12 months, you worried that your food would run out before you got the money to buy more.: Never true    Within the past 12 months, the food you bought just didn't last and you didn't have money to get more.: Never true  Transportation Needs: No Transportation Needs (03/12/2023)   Received from [facility] - Transportation    Lack of Transportation (Medical): No    Lack of Transportation (Non-Medical): No  Physical Activity: Insufficiently Active (03/12/2023)   Received from [facility]   Exercise Vital Sign    On average, how many days per week do you engage in moderate to strenuous exercise (like a brisk walk)?: 1 day    On average, how many minutes do you engage in exercise at this level?: 30 min  Stress: No Stress Concern Present (03/12/2023)   Received from [facility] - Occupational Stress Questionnaire    Feeling of Stress : Not at all  Social Connections: Socially Integrated (03/12/2023)   Received from [facility]   Social Network    How would you rate your social network (family,  work, friends)?: Good participation with social networks   Lives in a house. 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, Minerals in the house: no Engineer, Civil (consulting) in the family room: no Carpet in the bedroom: yes Heating: electric Cooling: central Pet: no  Family History: Family History  Problem Relation Age of Onset   High blood pressure Mother    Stroke Mother    High Cholesterol  Father    Breast cancer Cousin        paternal 1st cousin   Breast cancer Cousin        maternal 1st cousin   Problem                               Relation Asthma                                   no Eczema                                no Food allergy                          no Allergic rhino conjunctivitis     no  Review of Systems  Constitutional:  Negative for appetite change, chills, fever and unexpected weight change.  HENT:  Negative for congestion and rhinorrhea.   Eyes:  Negative for itching.  Respiratory:  Negative for cough, chest tightness, shortness of breath and wheezing.   Cardiovascular:  Negative for chest pain.  Gastrointestinal:  Negative for abdominal pain.  Genitourinary:  Negative for difficulty urinating.  Skin:  Positive for rash.  Neurological:  Negative for headaches.    Objective: BP 118/84 (BP Location: Left Arm, Patient Position: Sitting, Cuff Size: Normal)   Pulse 86   Temp 98.4 F (36.9 C) (Temporal)   Resp 16   Ht 5' 4 (1.626 m)   Wt 252 lb 12 oz (114.6 kg)   SpO2 96%   BMI 43.38 kg/m  Body mass index is 43.38 kg/m. Physical Exam Vitals and nursing note reviewed.  Constitutional:      Appearance: Normal appearance. She is well-developed.  HENT:     Head: Normocephalic and atraumatic.     Right Ear: Tympanic membrane and external ear normal.     Left Ear: Tympanic membrane and external ear normal.     Nose: Nose normal.     Mouth/Throat:     Mouth: Mucous membranes are moist.     Pharynx: Oropharynx is clear.  Eyes:      Conjunctiva/sclera: Conjunctivae normal.  Cardiovascular:     Rate and Rhythm: Normal rate and regular rhythm.     Heart sounds: Normal heart sounds. No murmur heard.    No friction rub. No gallop.  Pulmonary:     Effort: Pulmonary effort is normal.     Breath sounds: Normal breath sounds. No wheezing, rhonchi or rales.  Musculoskeletal:     Cervical back: Neck supple.  Skin:    General: Skin is warm.     Findings: Rash present.     Comments: Scattered urticarial rash on upper extremities, and lower extremities b/l.  Neurological:     Mental Status: She is alert and oriented to person, place, and time.  Psychiatric:        Behavior: Behavior normal.    The plan was reviewed with the patient/family, and all questions/concerned were addressed.  It was my pleasure to see Desteny today and participate in her care. Please feel free to contact me with any questions or concerns.  Sincerely,  Orlan Cramp, DO Allergy & Immunology  [facility] office: [phone] [facility] office:  [phone] 

## 2024-03-18 NOTE — Patient Instructions (Addendum)
 Hives: Based on clinical history, she likely has chronic idiopathic urticaria. Discussed with patient, that urticaria is usually caused by release of histamine by cutaneous mast cells but sometimes it is non-histamine mediated. Explained that urticaria is not always associated with allergies. In most cases, the exact etiology for urticaria can not be established and it is considered idiopathic. Discussed the role of adding omalizumab  (anti-IgE antibodies) in controlling urticaria in refractory patients. Informational pamphlet given.  Start zyrtec (cetirizine) 10mg  OR allegra (fexofenadine) 180mg  twice a day. If symptoms are not controlled or causes drowsiness let us  know. Start Pepcid (famotidine) 20mg  twice a day.  Avoid the following potential triggers: alcohol, tight clothing, NSAIDs, hot showers and getting overheated. See below for proper skin care.   If you have another flare within the next few days, send me a mychart message and may need to send in some additional steroids.   Get bloodwork. We are ordering labs, so please allow 1-2 weeks for the results to come back. With the newly implemented Cures Act, the labs might be visible to you at the same time that they become visible to me. However, I will not address the results until all of the results are back, so please be patient.   Return in about 4 weeks (around 04/15/2024). Or sooner if needed.   Skin care recommendations  Bath time: Always use lukewarm water. AVOID very hot or cold water. Keep bathing time to 5-10 minutes. Do NOT use bubble bath. Use a mild soap and use just enough to wash the dirty areas. Do NOT scrub skin vigorously.  After bathing, pat dry your skin with a towel. Do NOT rub or scrub the skin.  Moisturizers and prescriptions:  ALWAYS apply moisturizers immediately after bathing (within 3 minutes). This helps to lock-in moisture. Use the moisturizer several times a day over the whole body. Good summer  moisturizers include: Aveeno, CeraVe, Cetaphil. Good winter moisturizers include: Aquaphor, Vaseline, Cerave, Cetaphil, Eucerin, Vanicream. When using moisturizers along with medications, the moisturizer should be applied about one hour after applying the medication to prevent diluting effect of the medication or moisturize around where you applied the medications. When not using medications, the moisturizer can be continued twice daily as maintenance.  Laundry and clothing: Avoid laundry products with added color or perfumes. Use unscented hypo-allergenic laundry products such as Tide free, Cheer free & gentle, and All free and clear.  If the skin still seems dry or sensitive, you can try double-rinsing the clothes. Avoid tight or scratchy clothing such as wool. Do not use fabric softeners or dyer sheets.

## 2024-03-24 ENCOUNTER — Encounter: Payer: Self-pay | Admitting: Allergy

## 2024-03-31 ENCOUNTER — Ambulatory Visit: Payer: Self-pay | Admitting: Allergy

## 2024-03-31 ENCOUNTER — Encounter: Payer: Self-pay | Admitting: Allergy

## 2024-03-31 LAB — COMPREHENSIVE METABOLIC PANEL WITH GFR
AST: 18 IU/L (ref 0–40)
Albumin: 3.9 g/dL (ref 3.9–4.9)
Alkaline Phosphatase: 38 IU/L — ABNORMAL LOW (ref 41–116)
BUN/Creatinine Ratio: 15 (ref 9–23)
BUN: 14 mg/dL (ref 6–24)
Bilirubin Total: 0.6 mg/dL (ref 0.0–1.2)
CO2: 22 mmol/L (ref 20–29)
Calcium: 9.4 mg/dL (ref 8.7–10.2)
Chloride: 101 mmol/L (ref 96–106)
Creatinine, Ser: 0.96 mg/dL (ref 0.57–1.00)
Globulin, Total: 2.3 g/dL (ref 1.5–4.5)
Glucose: 90 mg/dL (ref 70–99)
Potassium: 3.8 mmol/L (ref 3.5–5.2)
Sodium: 136 mmol/L (ref 134–144)
Total Protein: 6.2 g/dL (ref 6.0–8.5)
eGFR: 74 mL/min/1.73 (ref >59)

## 2024-03-31 LAB — ALPHA-GAL PANEL
Allergen Lamb IgE: <0.1 kU/L
Beef IgE: <0.1 kU/L
IgE (Immunoglobulin E), Serum: 4 [IU]/mL — ABNORMAL LOW (ref 6–495)
O215-IgE Alpha-Gal: <0.1 kU/L
Pork IgE: <0.1 kU/L

## 2024-03-31 LAB — CHRONIC URTICARIA (CU) EVAL
ALT: 16 IU/L (ref 0–32)
Basophils Absolute: 0 x10E3/uL (ref 0.0–0.2)
Basos: 0 %
CRP: 61 mg/L — ABNORMAL HIGH (ref 0–10)
EOS (ABSOLUTE): 0.1 x10E3/uL (ref 0.0–0.4)
Eos: 2 %
Hematocrit: 46.1 % (ref 34.0–46.6)
Hemoglobin: 15.2 g/dL (ref 11.1–15.9)
Immature Grans (Abs): 0 x10E3/uL (ref 0.0–0.1)
Immature Granulocytes: 0 %
Lymphocytes Absolute: 1.8 x10E3/uL (ref 0.7–3.1)
Lymphs: 38 %
MCH: 28.9 pg (ref 26.6–33.0)
MCHC: 33 g/dL (ref 31.5–35.7)
MCV: 88 fL (ref 79–97)
Monocytes Absolute: 0.3 x10E3/uL (ref 0.1–0.9)
Monocytes: 7 %
Neutrophils Absolute: 2.4 x10E3/uL (ref 1.4–7.0)
Neutrophils: 53 %
Platelets: 302 x10E3/uL (ref 150–450)
Pooled Donor- BAT CU: 34.4 % — ABNORMAL HIGH (ref 0.00–10.60)
RBC: 5.26 x10E6/uL (ref 3.77–5.28)
RDW: 12.8 % (ref 11.7–15.4)
Sed Rate: 19 mm/h (ref 0–32)
TSH: 2.47 u[IU]/mL (ref 0.450–4.500)
Thyroperoxidase Ab SerPl-aCnc: <9 [IU]/mL (ref 0–34)
WBC: 4.6 x10E3/uL (ref 3.4–10.8)

## 2024-03-31 LAB — C3 AND C4
Complement C3, Serum: 175 mg/dL — ABNORMAL HIGH (ref 82–167)
Complement C4, Serum: 33 mg/dL (ref 12–38)

## 2024-03-31 LAB — ANTINUCLEAR ANTIBODIES, IFA: ANA Titer 1: NEGATIVE

## 2024-03-31 LAB — TRYPTASE: Tryptase: 11.7 ug/L (ref 2.2–13.2)

## 2024-03-31 MED ORDER — MONTELUKAST SODIUM 10 MG PO TABS: 10.0000 mg | ORAL_TABLET | Freq: Every day | ORAL | 2 refills | Status: AC

## 2024-04-09 LAB — COLOGUARD: COLOGUARD: NEGATIVE

## 2024-04-15 ENCOUNTER — Ambulatory Visit: Admitting: Allergy

## 2024-04-15 ENCOUNTER — Encounter: Payer: Self-pay | Admitting: Allergy

## 2024-04-15 ENCOUNTER — Telehealth: Payer: Self-pay

## 2024-04-15 VITALS — BP 126/86 | HR 100 | Temp 98.2°F

## 2024-04-15 DIAGNOSIS — L509 Urticaria, unspecified: Secondary | ICD-10-CM

## 2024-04-15 DIAGNOSIS — T783XXD Angioneurotic edema, subsequent encounter: Secondary | ICD-10-CM

## 2024-04-15 MED ORDER — METHYLPREDNISOLONE ACETATE 80 MG/ML IJ SUSP
80.0000 mg | Freq: Once | INTRAMUSCULAR | Status: AC
  Administered 2024-04-15: 80 mg via INTRAMUSCULAR

## 2024-04-15 MED ORDER — METHYLPREDNISOLONE ACETATE 80 MG/ML IJ SUSP: 80.0000 mg | Freq: Once | INTRAMUSCULAR | Status: DC

## 2024-04-15 MED ORDER — METHYLPREDNISOLONE 4 MG PO TBPK: ORAL_TABLET | ORAL | 0 refills | Status: AC

## 2024-04-15 NOTE — Patient Instructions (Addendum)
 Hives/lip swelling Based on clinical history, she likely has chronic idiopathic urticaria. Discussed with patient, that urticaria is usually caused by release of histamine by cutaneous mast cells but sometimes it is non-histamine mediated. Explained that urticaria is not always associated with allergies. In most cases, the exact etiology for urticaria can not be established and it is considered idiopathic. Start Xolair 300mg  injections every 4 weeks. Tammy will be in touch with you regarding coverage.  Consent was signed.  Steroid injection given today. Start Medrol pak.   Start zyrtec  (cetirizine ) 20mg  (2 tablets) twice a day. If symptoms are not controlled or causes drowsiness let us  know. Continue Pepcid  (famotidine ) 20mg  twice a day.  Start Singulair  (montelukast ) 10mg  daily at night. Cautioned that in some children/adults can experience behavioral changes including hyperactivity, agitation, depression, sleep disturbances and suicidal ideations. These side effects are rare, but if you notice them you should notify me and discontinue Singulair  (montelukast ). Avoid the following potential triggers: alcohol, tight clothing, NSAIDs, hot showers and getting overheated.  Tryptase level was 11.7 which is in the normal range. This checks for how many mast cells you have in your body. Your levels were normal however sometimes when it is above 8, I offer patients a genetic test to check for hereditary alpha-tryptasemia. This does NOT have to be hereditary. It is done through [facility] and unfortunately it is an out of pocket expense of $169.  You can read more about it on their website. [email or web address]://[email or web address].[email or web address]/tryptase  Return in about 2 months (around 06/16/2024). Or sooner if needed.

## 2024-04-15 NOTE — Progress Notes (Signed)
 Follow Up Note  RE: Helen Clark MRN: [id number] DOB: 1975-07-23 Date of Office Visit: 04/15/2024  Referring provider: Gayle Saddie JULIANNA DEVONNA Primary care provider: Gayle Saddie JULIANNA, PA-C  Chief Complaint: Follow-up and Angioedema  History of Present Illness: I had the pleasure of seeing Helen Clark for a follow up visit at the [facility] on 04/15/2024. She is a 48 y.o. female, who is being followed for CIU with angioedema. Her previous allergy office visit was on 03/18/2024 with Dr. Luke. Today is a regular follow up visit.  Discussed the use of AI scribe software for clinical note transcription with the patient, who gave verbal consent to proceed.    She experiences recurrent episodes of hives and swelling, primarily affecting her lips and hands. The most recent episode occurred today, with noticeable swelling of her lips and hands. A similar episode occurred after consuming Cheez-Its at work on the Wednesday before Thanksgiving. However, she did not experience any symptoms during Thanksgiving or when eating home-cooked meals. She describes the swelling as causing tightness, particularly in her lips, which she noticed after eating around noon today. Similar symptoms occurred after eating popcorn at the movies.  She has been taking Pepcid  and Zyrtec  (cetirizine ) twice daily, and Singulair  as needed, though she does not take Singulair  daily. She last took Singulair  a week ago. Despite these medications, she experienced swelling today. Her blood work showed a tryptase level of 11.7. She has not had any recent steroid treatments since her last visit to [facility].  She has a history of high blood pressure and is currently taking Diovan . She has not noticed any changes in her blood pressure or other symptoms related to this medication. She also mentions a family history of kidney disease.  No difficulty breathing or eating despite the lip swelling. The swelling causes tightness but  does not impede her ability to breathe or eat.      2025 labs: Blood count, kidney function, liver function, electrolytes, thyroid, autoimmune screener, tryptase (checks for mast cell issues) and alpha gal (checks for red meat allergy) were all normal which is great.    One of your inflammation markers was elevated (crp) but the rest was normal.   Tryptase level was 11.7 which is in the normal range. This checks for how many mast cells you have in your body. Your levels were normal however sometimes when it is above 8, I offer patients a genetic test to check for hereditary alpha-tryptasemia. This does NOT have to be hereditary. It is done through [facility] and unfortunately it is an out of pocket expense of $169.  You can read more about it on their website. https://www.[facility].com/service/tryptase   We can also discuss further at your next visit if it would be a worthwhile test to order or not.    Your chronic urticaria lab was elevated meaning your body is producing excess amounts of histamine. Usually there is no allergic trigger for this and the treatment is antihistamines.    Continue zyrtec  (cetirizine ) 10mg  OR allegra (fexofenadine) 180mg  twice a day. If symptoms are not controlled or causes drowsiness let us  know. Continue Pepcid  (famotidine ) 20mg  twice a day.    Keep December appointment.   Regarding the lip swelling - I'll send in another medication called singulair . Start Singulair  (montelukast ) 10mg  daily at night.     Assessment and Plan: Helen Clark is a 48 y.o. female with: Chronic idiopathic urticaria with angioedema Past history - persistent  urticaria and angioedema without identifiable triggers. Previous prednisone provided temporary relief. Food panel skin testing was all negative per patient done at outside facility. Interim history - 2025 labs unremarkable except tryptase of 11.8 and elevated CU. Did not take daily meds as instructed. Had 2 lip swelling  episodes. No additional prednisone though. Based on clinical history, she likely has chronic idiopathic urticaria. Discussed with patient, that urticaria is usually caused by release of histamine by cutaneous mast cells but sometimes it is non-histamine mediated. Explained that urticaria is not always associated with allergies. In most cases, the exact etiology for urticaria can not be established and it is considered idiopathic. Start Xolair 300mg  injections every 4 weeks. Helen Clark will be in touch with you regarding coverage.  Consent was signed. Steroid injection 80mg  given today. Start Medrol pak.  Start zyrtec  (cetirizine ) 20mg  (2 tablets) twice a day. If symptoms are not controlled or causes drowsiness let us  know. Continue Pepcid  (famotidine ) 20mg  twice a day.  Start Singulair  (montelukast ) 10mg  daily at night. Cautioned that in some children/adults can experience behavioral changes including hyperactivity, agitation, depression, sleep disturbances and suicidal ideations. These side effects are rare, but if you notice them you should notify me and discontinue Singulair  (montelukast ). Avoid the following potential triggers: alcohol, tight clothing, NSAIDs, hot showers and getting overheated. Tryptase level was 11.7 which is in the normal range. This checks for how many mast cells you have in your body. Your levels were normal however sometimes when it is above 8, I offer patients a genetic test to check for hereditary alpha-tryptasemia. This does NOT have to be hereditary. It is done through [facility] and unfortunately it is an out of pocket expense of $169.  You can read more about it on their website. https://www.[facility].com/service/tryptase   Return in about 2 months (around 06/16/2024).  Meds ordered this encounter  Medications   methylPREDNISolone (MEDROL DOSEPAK) 4 MG TBPK tablet    Sig: Take 6 tablets on day 1, 5 tablets on day 2, 4 tabs on day 3, 3 tabs on day 4, 2 tabs on day 5,  1 tab on day 6.    Dispense:  21 tablet    Refill:  0   DISCONTD: methylPREDNISolone acetate (DEPO-MEDROL) injection 80 mg   methylPREDNISolone acetate (DEPO-MEDROL) injection 80 mg   Lab Orders  No laboratory test(s) ordered today    Diagnostics: None.   Medication List:  Current Outpatient Medications  Medication Sig Dispense Refill   acetaminophen (TYLENOL) 500 MG tablet Take 1,000 mg by mouth every 6 (six) hours as needed (for pain.).     cetirizine  (ZYRTEC  ALLERGY) 10 MG tablet Take 1 tablet (10 mg total) by mouth 2 (two) times daily. 60 tablet 2   clindamycin  (CLEOCIN  T) 1 % external solution Apply topically daily as needed. 30 mL 2   famotidine  (PEPCID ) 20 MG tablet Take 1 tablet (20 mg total) by mouth 2 (two) times daily. 60 tablet 2   hydrOXYzine (ATARAX) 25 MG tablet Take 25 mg by mouth 4 (four) times daily as needed.     lubiprostone  (AMITIZA ) 8 MCG capsule Take 1 capsule (8 mcg total) by mouth 2 (two) times daily with a meal. 180 capsule 2   medroxyPROGESTERone  Acetate 150 MG/ML SUSY Inject 1 mL (150 mg total) into the muscle every 3 (three) months. 1 mL 3   methylPREDNISolone (MEDROL DOSEPAK) 4 MG TBPK tablet Take 6 tablets on day 1, 5 tablets on day 2, 4 tabs on  day 3, 3 tabs on day 4, 2 tabs on day 5, 1 tab on day 6. 21 tablet 0   montelukast  (SINGULAIR ) 10 MG tablet Take 1 tablet (10 mg total) by mouth at bedtime. 30 tablet 2   Multiple Vitamin (MULTIVITAMIN WITH MINERALS) TABS tablet Take 1 tablet by mouth every evening.     triamcinolone cream (KENALOG) 0.1 % Apply topically 2 (two) times daily.     valsartan -hydrochlorothiazide  (DIOVAN -HCT) 160-25 MG tablet TAKE 1 TABLET BY MOUTH EVERY DAY 90 tablet 3   metFORMIN  (GLUCOPHAGE ) 500 MG tablet Take 1 tablet (500 mg total) by mouth 2 (two) times daily with a meal. (Patient not taking: Reported on 04/15/2024) 180 tablet 3   tirzepatide  (ZEPBOUND ) 2.5 MG/0.5ML Pen Inject 2.5 mg into the skin once a week. (Patient not taking:  Reported on 04/15/2024) 2 mL 0   No current facility-administered medications for this visit.   Allergies: Allergies[1] I reviewed her past medical history, social history, family history, and environmental history and no significant changes have been reported from her previous visit.  Review of Systems  Constitutional:  Negative for appetite change, chills, fever and unexpected weight change.  HENT:  Negative for congestion and rhinorrhea.   Eyes:  Negative for itching.  Respiratory:  Negative for cough, chest tightness, shortness of breath and wheezing.   Cardiovascular:  Negative for chest pain.  Gastrointestinal:  Negative for abdominal pain.  Genitourinary:  Negative for difficulty urinating.  Skin:  Positive for rash.  Neurological:  Negative for headaches.    Objective: BP 126/86   Pulse 100   Temp 98.2 F (36.8 C)   SpO2 97%  There is no height or weight on file to calculate BMI. Physical Exam Vitals and nursing note reviewed.  Constitutional:      Appearance: Normal appearance. She is well-developed.  HENT:     Head: Normocephalic and atraumatic.     Right Ear: Tympanic membrane and external ear normal.     Left Ear: Tympanic membrane and external ear normal.     Nose: Nose normal.     Mouth/Throat:     Mouth: Mucous membranes are moist.     Pharynx: Oropharynx is clear.     Comments: Significant lower lip swelling. Eyes:     Conjunctiva/sclera: Conjunctivae normal.  Cardiovascular:     Rate and Rhythm: Normal rate and regular rhythm.     Heart sounds: Normal heart sounds. No murmur heard.    No friction rub. No gallop.  Pulmonary:     Effort: Pulmonary effort is normal.     Breath sounds: Normal breath sounds. No wheezing, rhonchi or rales.  Musculoskeletal:     Cervical back: Neck supple.  Skin:    General: Skin is warm.     Findings: Rash present.  Neurological:     Mental Status: She is alert and oriented to person, place, and time.  Psychiatric:         Behavior: Behavior normal.    Previous notes and tests were reviewed. The plan was reviewed with the patient/family, and all questions/concerned were addressed.  It was my pleasure to see Helen Clark today and participate in her care. Please feel free to contact me with any questions or concerns.  Sincerely,  Orlan Cramp, DO Allergy & Immunology  [facility]  [facility] office: [phone] [facility] office: [phone]     [1] No Known Allergies

## 2024-04-15 NOTE — Telephone Encounter (Signed)
 Please initiate prior authorization for Xolair 300mg  q28 days for chronic urticaria

## 2024-04-15 NOTE — Addendum Note (Signed)
 Addended by: OTHA MADELIN HERO on: 04/15/2024 04:51 PM   Modules accepted: Orders

## 2024-04-16 ENCOUNTER — Other Ambulatory Visit (HOSPITAL_COMMUNITY): Payer: Self-pay

## 2024-04-16 ENCOUNTER — Other Ambulatory Visit: Payer: Self-pay

## 2024-04-16 ENCOUNTER — Telehealth: Payer: Self-pay

## 2024-04-16 NOTE — Telephone Encounter (Signed)
*  AA  Pharmacy Patient Advocate Encounter   Received notification from Pt Calls Messages that prior authorization for Xolair Auto Injector 300mg   is required/requested.   Insurance verification completed.   The patient is insured through [employer].   Per test claim: PA required; PA submitted to above mentioned insurance via Latent Key/confirmation #/EOC [id number] Status is pending

## 2024-04-16 NOTE — Telephone Encounter (Signed)
 Your request has been approved Request Reference Number: [id number]. XOLAIR INJ 300/2ML is approved through 04/16/2025. Your patient may now fill this prescription and it will be covered. Authorization Expiration12/04/2025

## 2024-04-16 NOTE — Telephone Encounter (Signed)
 Starting request-

## 2024-04-16 NOTE — Telephone Encounter (Signed)
 Patient must use [facility]

## 2024-04-19 ENCOUNTER — Telehealth: Payer: Self-pay | Admitting: *Deleted

## 2024-04-19 MED ORDER — XOLAIR 300 MG/2ML ~~LOC~~ SOAJ: 300.0000 mg | SUBCUTANEOUS | 11 refills | Status: AC

## 2024-04-19 NOTE — Telephone Encounter (Signed)
 Helen Clark cannot fill medication and I reached out to patient and advised approval copay card and submit to [facility] for Xolair . Will reach out once delivery set to make appt to start therapy in clinic with one hour wait

## 2024-04-19 NOTE — Telephone Encounter (Signed)
-----   Message from Delon CHRISTELLA Brow sent at 04/19/2024  9:43 AM EST ----- Madelin, once Juliana has the letter back she will have loaded it in Media tab. ----- Message ----- From: Otha Madelin CHRISTELLA, CMA Sent: 04/19/2024   9:31 AM EST To: Delon CHRISTELLA Brow, RPH-CPP  Can you send me approval for my records and to submit to [facility]aya Hora ----- Message ----- From: Brow Delon CHRISTELLA, RPH-CPP Sent: 04/16/2024  10:54 AM EST To: Madelin CHRISTELLA Otha, CMA; Orlan CHRISTELLA Cramp, DO  Xolair  approved but patient must fill with [facility]. Please send Rx accordingly.

## 2024-05-28 ENCOUNTER — Other Ambulatory Visit

## 2024-06-04 ENCOUNTER — Ambulatory Visit

## 2024-06-08 ENCOUNTER — Other Ambulatory Visit: Payer: Self-pay | Admitting: Allergy

## 2024-06-11 ENCOUNTER — Encounter: Payer: Self-pay | Admitting: *Deleted

## 2024-06-11 LAB — GLUCOSE, POCT (MANUAL RESULT ENTRY): Glucose Fasting, POC: 84 mg/dL (ref 70–99)

## 2024-06-11 LAB — HEMOGLOBIN A1C: Hemoglobin A1C: 5.2

## 2024-06-11 NOTE — Progress Notes (Incomplete)
 Pt attended 06/11/24 screening event where her BP was 123/79, A1c was 5.2 and blood glucose was 84. Pt documented that she does have a PCP but did not document insurance. Pt did not note SDOH needs at the time of the event.

## 2024-06-17 ENCOUNTER — Ambulatory Visit: Admitting: Allergy

## 2024-06-25 ENCOUNTER — Other Ambulatory Visit

## 2024-07-02 ENCOUNTER — Ambulatory Visit
